# Patient Record
Sex: Male | Born: 1955 | Race: White | Hispanic: No | Smoking: Current every day smoker
Health system: Southern US, Community
[De-identification: ages and names within clinical notes are randomized; demographics above are authoritative.]

## PROBLEM LIST (undated history)

## (undated) DIAGNOSIS — G4730 Sleep apnea, unspecified: Secondary | ICD-10-CM

## (undated) DIAGNOSIS — I10 Essential (primary) hypertension: Secondary | ICD-10-CM

## (undated) DIAGNOSIS — E785 Hyperlipidemia, unspecified: Secondary | ICD-10-CM

## (undated) DIAGNOSIS — E079 Disorder of thyroid, unspecified: Secondary | ICD-10-CM

## (undated) DIAGNOSIS — E039 Hypothyroidism, unspecified: Secondary | ICD-10-CM

## (undated) HISTORY — DX: Essential (primary) hypertension: I10

## (undated) HISTORY — DX: Hyperlipidemia, unspecified: E78.5

## (undated) HISTORY — DX: Disorder of thyroid, unspecified: E07.9

## (undated) HISTORY — PX: THYROIDECTOMY: SHX17

---

## 2001-04-23 ENCOUNTER — Encounter: Admission: RE | Admit: 2001-04-23 | Discharge: 2001-04-23 | Payer: Self-pay | Admitting: Family Medicine

## 2001-04-23 ENCOUNTER — Encounter: Payer: Self-pay | Admitting: Family Medicine

## 2005-03-13 ENCOUNTER — Encounter: Admission: RE | Admit: 2005-03-13 | Discharge: 2005-03-13 | Payer: Self-pay | Admitting: Family Medicine

## 2005-03-17 ENCOUNTER — Encounter: Admission: RE | Admit: 2005-03-17 | Discharge: 2005-03-17 | Payer: Self-pay | Admitting: Family Medicine

## 2005-03-21 ENCOUNTER — Encounter: Admission: RE | Admit: 2005-03-21 | Discharge: 2005-03-21 | Payer: Self-pay | Admitting: Family Medicine

## 2005-05-22 ENCOUNTER — Encounter: Admission: RE | Admit: 2005-05-22 | Discharge: 2005-05-22 | Payer: Self-pay | Admitting: Endocrinology

## 2005-05-22 ENCOUNTER — Other Ambulatory Visit: Admission: RE | Admit: 2005-05-22 | Discharge: 2005-05-22 | Payer: Self-pay | Admitting: Interventional Radiology

## 2005-09-11 ENCOUNTER — Ambulatory Visit (HOSPITAL_COMMUNITY): Admission: RE | Admit: 2005-09-11 | Discharge: 2005-09-12 | Payer: Self-pay | Admitting: Surgery

## 2008-11-27 ENCOUNTER — Ambulatory Visit: Payer: Self-pay | Admitting: Internal Medicine

## 2013-09-28 ENCOUNTER — Ambulatory Visit
Admission: RE | Admit: 2013-09-28 | Discharge: 2013-09-28 | Disposition: A | Discharge status: Home / Self Care | Payer: 59 | Source: Ambulatory Visit | Attending: Family Medicine | Admitting: Family Medicine

## 2013-09-28 ENCOUNTER — Other Ambulatory Visit: Payer: Self-pay | Admitting: Family Medicine

## 2013-09-28 DIAGNOSIS — R062 Wheezing: Secondary | ICD-10-CM

## 2013-10-19 ENCOUNTER — Other Ambulatory Visit: Payer: Self-pay | Admitting: Family Medicine

## 2013-10-19 DIAGNOSIS — R059 Cough, unspecified: Secondary | ICD-10-CM

## 2013-10-19 DIAGNOSIS — R05 Cough: Secondary | ICD-10-CM

## 2013-10-20 ENCOUNTER — Inpatient Hospital Stay: Admission: RE | Admit: 2013-10-20 | Payer: 59 | Source: Ambulatory Visit

## 2013-10-21 ENCOUNTER — Ambulatory Visit
Admission: RE | Admit: 2013-10-21 | Discharge: 2013-10-21 | Disposition: A | Discharge status: Home / Self Care | Payer: 59 | Source: Ambulatory Visit | Attending: Family Medicine | Admitting: Family Medicine

## 2013-10-21 DIAGNOSIS — R05 Cough: Secondary | ICD-10-CM

## 2013-10-21 DIAGNOSIS — R059 Cough, unspecified: Secondary | ICD-10-CM

## 2013-10-21 MED ORDER — IOHEXOL 300 MG/ML  SOLN
75.0000 mL | Freq: Once | INTRAMUSCULAR | Status: AC | PRN
  Administered 2013-10-21: 75 mL via INTRAVENOUS

## 2015-01-14 HISTORY — PX: CATARACT EXTRACTION: SUR2

## 2015-06-07 ENCOUNTER — Ambulatory Visit: Payer: Self-pay | Admitting: General Surgery

## 2016-02-29 DIAGNOSIS — E039 Hypothyroidism, unspecified: Secondary | ICD-10-CM | POA: Diagnosis not present

## 2016-03-06 DIAGNOSIS — H35372 Puckering of macula, left eye: Secondary | ICD-10-CM | POA: Diagnosis not present

## 2016-03-06 DIAGNOSIS — H43811 Vitreous degeneration, right eye: Secondary | ICD-10-CM | POA: Diagnosis not present

## 2016-03-06 DIAGNOSIS — H35363 Drusen (degenerative) of macula, bilateral: Secondary | ICD-10-CM | POA: Diagnosis not present

## 2016-03-06 DIAGNOSIS — H35411 Lattice degeneration of retina, right eye: Secondary | ICD-10-CM | POA: Diagnosis not present

## 2016-04-23 DIAGNOSIS — E039 Hypothyroidism, unspecified: Secondary | ICD-10-CM | POA: Diagnosis not present

## 2016-04-23 DIAGNOSIS — E7800 Pure hypercholesterolemia, unspecified: Secondary | ICD-10-CM | POA: Diagnosis not present

## 2016-08-05 DIAGNOSIS — E039 Hypothyroidism, unspecified: Secondary | ICD-10-CM | POA: Diagnosis not present

## 2016-08-05 DIAGNOSIS — I10 Essential (primary) hypertension: Secondary | ICD-10-CM | POA: Diagnosis not present

## 2016-08-05 DIAGNOSIS — E782 Mixed hyperlipidemia: Secondary | ICD-10-CM | POA: Diagnosis not present

## 2016-08-05 DIAGNOSIS — G4733 Obstructive sleep apnea (adult) (pediatric): Secondary | ICD-10-CM | POA: Diagnosis not present

## 2016-11-11 DIAGNOSIS — Z23 Encounter for immunization: Secondary | ICD-10-CM | POA: Diagnosis not present

## 2017-02-06 DIAGNOSIS — E039 Hypothyroidism, unspecified: Secondary | ICD-10-CM | POA: Diagnosis not present

## 2017-02-06 DIAGNOSIS — I10 Essential (primary) hypertension: Secondary | ICD-10-CM | POA: Diagnosis not present

## 2017-02-06 DIAGNOSIS — Z125 Encounter for screening for malignant neoplasm of prostate: Secondary | ICD-10-CM | POA: Diagnosis not present

## 2017-02-06 DIAGNOSIS — Z0000 Encounter for general adult medical examination without abnormal findings: Secondary | ICD-10-CM | POA: Diagnosis not present

## 2017-02-23 DIAGNOSIS — Z720 Tobacco use: Secondary | ICD-10-CM | POA: Diagnosis not present

## 2017-02-23 DIAGNOSIS — G4733 Obstructive sleep apnea (adult) (pediatric): Secondary | ICD-10-CM | POA: Diagnosis not present

## 2017-02-26 ENCOUNTER — Encounter (HOSPITAL_COMMUNITY): Payer: Self-pay | Admitting: *Deleted

## 2017-02-26 DIAGNOSIS — K4020 Bilateral inguinal hernia, without obstruction or gangrene, not specified as recurrent: Secondary | ICD-10-CM | POA: Diagnosis not present

## 2017-02-26 DIAGNOSIS — K429 Umbilical hernia without obstruction or gangrene: Secondary | ICD-10-CM | POA: Diagnosis not present

## 2017-02-27 ENCOUNTER — Ambulatory Visit: Payer: Self-pay | Admitting: General Surgery

## 2017-02-27 NOTE — H&P (Signed)
  palpable masses. Face Global Assessment - atraumatic. Thyroid Gland Characteristics - normal size and consistency.  Eye Eyeball - Bilateral-Extraocular movements intact. Sclera/Conjunctiva - Bilateral-No scleral icterus, No Discharge.  ENMT Nose and Sinuses External Inspection of the Nose - no deformities observed, no swelling present.  Chest and Lung Exam Palpation Palpation of the chest reveals - Non-tender. Auscultation Breath sounds - Normal.  Cardiovascular Auscultation Rhythm - Regular. Heart Sounds - S1 WNL and S2 WNL. Carotid arteries - No Carotid bruit.  Abdomen Inspection Inspection of the abdomen reveals - No Visible peristalsis, No Abnormal pulsations and No Paradoxical movements. Palpation/Percussion Palpation and Percussion of the abdomen reveal - Soft, Non Tender, No Rebound tenderness, No Rigidity (guarding), No hepatosplenomegaly and No Palpable abdominal masses. Note: left groin bulge and palpable defect, right small inguinal palpable defect, small umbilical hernia   Peripheral Vascular Upper Extremity Palpation - Pulses bilaterally normal. Lower Extremity Palpation - Edema - Bilateral - No edema.  Neurologic Neurologic evaluation reveals -normal sensation and normal coordination.  Neuropsychiatric Mental status exam performed with findings of-able to articulate well with normal speech/language, rate, volume and coherence and thought content normal with ability to perform basic computations and apply abstract reasoning.  Musculoskeletal Normal Exam - Bilateral-Upper Extremity Strength Normal and Lower Extremity Strength Normal.    Assessment & Plan John Randolph(Ismael Karge A. Red Mandt MD; 02/26/2017 11:56 AM) UMBILICAL HERNIA WITHOUT OBSTRUCTION AND WITHOUT GANGRENE (K42.9) Impression: We will plan to perform a primary umbilical hernia repair at time of the laparoscopic bilateral inguinal hernia repair. BILATERAL INGUINAL HERNIA WITHOUT OBSTRUCTION OR  GANGRENE, RECURRENCE NOT SPECIFIED (K40.20) Impression: 62 year old male with bilateral inguinal hernias reducible on exam. The patient has a symptomatic reducible hernia. We discussed the etiology of his hernia, the risk of it enlarging, incarceration, obstruction, strangulation, and that it is unlikely to get smaller or better on its own. We discussed operative options of laparoscopic vs open repair with mesh including the risks of recurrence, injury to intestines or abominal organs, chronic pain associated with mesh, ischemic orchiditis or testicular injury requiring orchiectomy. We decided to proceed with laparoscopic bilatera inguinal hernia repair with mesh.

## 2017-02-27 NOTE — H&P (View-Only) (Signed)
  palpable masses. Face Global Assessment - atraumatic. Thyroid Gland Characteristics - normal size and consistency.  Eye Eyeball - Bilateral-Extraocular movements intact. Sclera/Conjunctiva - Bilateral-No scleral icterus, No Discharge.  ENMT Nose and Sinuses External Inspection of the Nose - no deformities observed, no swelling present.  Chest and Lung Exam Palpation Palpation of the chest reveals - Non-tender. Auscultation Breath sounds - Normal.  Cardiovascular Auscultation Rhythm - Regular. Heart Sounds - S1 WNL and S2 WNL. Carotid arteries - No Carotid bruit.  Abdomen Inspection Inspection of the abdomen reveals - No Visible peristalsis, No Abnormal pulsations and No Paradoxical movements. Palpation/Percussion Palpation and Percussion of the abdomen reveal - Soft, Non Tender, No Rebound tenderness, No Rigidity (guarding), No hepatosplenomegaly and No Palpable abdominal masses. Note: left groin bulge and palpable defect, right small inguinal palpable defect, small umbilical hernia   Peripheral Vascular Upper Extremity Palpation - Pulses bilaterally normal. Lower Extremity Palpation - Edema - Bilateral - No edema.  Neurologic Neurologic evaluation reveals -normal sensation and normal coordination.  Neuropsychiatric Mental status exam performed with findings of-able to articulate well with normal speech/language, rate, volume and coherence and thought content normal with ability to perform basic computations and apply abstract reasoning.  Musculoskeletal Normal Exam - Bilateral-Upper Extremity Strength Normal and Lower Extremity Strength Normal.    Assessment & Plan (Luke A. Kinsinger MD; 02/26/2017 11:56 AM) UMBILICAL HERNIA WITHOUT OBSTRUCTION AND WITHOUT GANGRENE (K42.9) Impression: We will plan to perform a primary umbilical hernia repair at time of the laparoscopic bilateral inguinal hernia repair. BILATERAL INGUINAL HERNIA WITHOUT OBSTRUCTION OR  GANGRENE, RECURRENCE NOT SPECIFIED (K40.20) Impression: 62-year-old male with bilateral inguinal hernias reducible on exam. The patient has a symptomatic reducible hernia. We discussed the etiology of his hernia, the risk of it enlarging, incarceration, obstruction, strangulation, and that it is unlikely to get smaller or better on its own. We discussed operative options of laparoscopic vs open repair with mesh including the risks of recurrence, injury to intestines or abominal organs, chronic pain associated with mesh, ischemic orchiditis or testicular injury requiring orchiectomy. We decided to proceed with laparoscopic bilatera inguinal hernia repair with mesh. 

## 2017-03-03 NOTE — Patient Instructions (Addendum)
John Randolph  03/03/2017   Your procedure is scheduled on: Thursday 03/12/2017  Report to [facility] Main  Entrance              Report to admitting at  0730  AM   Call this number if you have problems the morning of surgery [phone]   NO SOLID FOOD AFTER MIDNIGHT THE NIGHT PRIOR TO SURGERY. NOTHING BY MOUTH EXCEPT CLEAR LIQUIDS UNTIL 3 HOURS PRIOR TO SCHEULED SURGERY. PLEASE FINISH ENSURE DRINK PER SURGEON ORDER 3 HOURS PRIOR TO SCHEDULED SURGERY TIME WHICH NEEDS TO BE COMPLETED AT  0630 am.   CLEAR LIQUID DIET   Foods Allowed                                                                     Foods Excluded  Coffee and tea, regular and decaf                             liquids that you cannot  Plain Jell-O in any flavor                                             see through such as: Fruit ices (not with fruit pulp)                                     milk, soups, orange juice  Iced Popsicles                                    All solid food Carbonated beverages, regular and diet                                    Cranberry, grape and apple juices Sports drinks like Gatorade Lightly seasoned clear broth or consume(fat free) Sugar, honey syrup  Sample Menu Breakfast                                Lunch                                     Supper Cranberry juice                    Beef broth                            Chicken broth Jell-O  Grape juice                           Apple juice Coffee or tea                        Jell-O                                      Popsicle                                                Coffee or tea                        Coffee or tea  _____________________________________________________________________    Remember: Do not eat food or drink liquids :After Midnight.     Take these medicines the morning of surgery with A SIP OF WATER: LEVOTHYROXINE (SYNTHROID)                         You may not have any metal on your body including hair pins and              piercings  Do not wear jewelry, make-up, lotions, powders or perfumes, deodorant             Do not wear nail polish.  Do not shave  48 hours prior to surgery.              Men may shave face and neck.   Do not bring valuables to the hospital. [facility] IS NOT             RESPONSIBLE   FOR VALUABLES.  Contacts, dentures or bridgework may not be worn into surgery.  Leave suitcase in the car. After surgery it may be brought to your room.     Patients discharged the day of surgery will not be allowed to drive home.  Name and phone number of your driver:daughter -Elmer Bales  Special Instructions: N/A              Please read over the following fact sheets you were given: _____________________________________________________________________             [facility] - Preparing for Surgery Before surgery, you can play an important role.  Because skin is not sterile, your skin needs to be as free of germs as possible.  You can reduce the number of germs on your skin by washing with CHG (chlorahexidine gluconate) soap before surgery.  CHG is an antiseptic cleaner which kills germs and bonds with the skin to continue killing germs even after washing. Please DO NOT use if you have an allergy to CHG or antibacterial soaps.  If your skin becomes reddened/irritated stop using the CHG and inform your nurse when you arrive at Short Stay. Do not shave (including legs and underarms) for at least 48 hours prior to the first CHG shower.  You may shave your face/neck. Please follow these instructions carefully:  1.  Shower with CHG Soap the night before surgery and the  morning of Surgery.  2.  If you choose to wash your hair, wash your hair  first as usual with your  normal  shampoo.  3.  After you shampoo, rinse your hair and body thoroughly to remove the  shampoo.                           4.  Use CHG  as you would any other liquid soap.  You can apply chg directly  to the skin and wash                       Gently with a scrungie or clean washcloth.  5.  Apply the CHG Soap to your body ONLY FROM THE NECK DOWN.   Do not use on face/ open                           Wound or open sores. Avoid contact with eyes, ears mouth and genitals (private parts).                       Wash face,  Genitals (private parts) with your normal soap.             6.  Wash thoroughly, paying special attention to the area where your surgery  will be performed.  7.  Thoroughly rinse your body with warm water from the neck down.  8.  DO NOT shower/wash with your normal soap after using and rinsing off  the CHG Soap.                9.  Pat yourself dry with a clean towel.            10.  Wear clean pajamas.            11.  Place clean sheets on your bed the night of your first shower and do not  sleep with pets. Day of Surgery : Do not apply any lotions/deodorants the morning of surgery.  Please wear clean clothes to the hospital/surgery center.  FAILURE TO FOLLOW THESE INSTRUCTIONS MAY RESULT IN THE CANCELLATION OF YOUR SURGERY PATIENT SIGNATURE_________________________________  NURSE SIGNATURE__________________________________  ________________________________________________________________________

## 2017-03-04 ENCOUNTER — Encounter (HOSPITAL_COMMUNITY)
Admission: RE | Admit: 2017-03-04 | Discharge: 2017-03-04 | Disposition: A | Discharge status: Home / Self Care | Payer: 59 | Source: Ambulatory Visit | Attending: General Surgery | Admitting: General Surgery

## 2017-03-04 ENCOUNTER — Other Ambulatory Visit: Payer: Self-pay

## 2017-03-04 ENCOUNTER — Encounter (HOSPITAL_COMMUNITY): Payer: Self-pay

## 2017-03-04 ENCOUNTER — Encounter (INDEPENDENT_AMBULATORY_CARE_PROVIDER_SITE_OTHER): Payer: Self-pay

## 2017-03-04 DIAGNOSIS — I10 Essential (primary) hypertension: Secondary | ICD-10-CM | POA: Diagnosis not present

## 2017-03-04 DIAGNOSIS — I4510 Unspecified right bundle-branch block: Secondary | ICD-10-CM | POA: Insufficient documentation

## 2017-03-04 DIAGNOSIS — Z01818 Encounter for other preprocedural examination: Secondary | ICD-10-CM | POA: Diagnosis not present

## 2017-03-04 DIAGNOSIS — R9431 Abnormal electrocardiogram [ECG] [EKG]: Secondary | ICD-10-CM | POA: Insufficient documentation

## 2017-03-04 DIAGNOSIS — K4020 Bilateral inguinal hernia, without obstruction or gangrene, not specified as recurrent: Secondary | ICD-10-CM | POA: Insufficient documentation

## 2017-03-04 HISTORY — DX: Hypothyroidism, unspecified: E03.9

## 2017-03-04 HISTORY — DX: Sleep apnea, unspecified: G47.30

## 2017-03-04 LAB — CBC WITH DIFFERENTIAL/PLATELET
BASOS ABS: 0 10*3/uL (ref 0.0–0.1)
BASOS PCT: 1 %
Eosinophils Absolute: 0.1 10*3/uL (ref 0.0–0.7)
Eosinophils Relative: 1 %
HEMATOCRIT: 45.3 % (ref 39.0–52.0)
Hemoglobin: 16 g/dL (ref 13.0–17.0)
LYMPHS PCT: 26 %
Lymphs Abs: 2.2 10*3/uL (ref 0.7–4.0)
MCH: 31.7 pg (ref 26.0–34.0)
MCHC: 35.3 g/dL (ref 30.0–36.0)
MCV: 89.9 fL (ref 78.0–100.0)
Monocytes Absolute: 0.9 10*3/uL (ref 0.1–1.0)
Monocytes Relative: 11 %
NEUTROS ABS: 5.3 10*3/uL (ref 1.7–7.7)
Neutrophils Relative %: 61 %
PLATELETS: 253 10*3/uL (ref 150–400)
RBC: 5.04 MIL/uL (ref 4.22–5.81)
RDW: 13.8 % (ref 11.5–15.5)
WBC: 8.5 10*3/uL (ref 4.0–10.5)

## 2017-03-04 LAB — BASIC METABOLIC PANEL
ANION GAP: 11 (ref 5–15)
BUN: 14 mg/dL (ref 6–20)
CO2: 23 mmol/L (ref 22–32)
Calcium: 8.8 mg/dL — ABNORMAL LOW (ref 8.9–10.3)
Chloride: 104 mmol/L (ref 101–111)
Creatinine, Ser: 0.99 mg/dL (ref 0.61–1.24)
GFR calc non Af Amer: >60 mL/min (ref >60)
Glucose, Bld: 102 mg/dL — ABNORMAL HIGH (ref 65–99)
POTASSIUM: 4.1 mmol/L (ref 3.5–5.1)
SODIUM: 138 mmol/L (ref 135–145)

## 2017-03-09 ENCOUNTER — Other Ambulatory Visit (HOSPITAL_BASED_OUTPATIENT_CLINIC_OR_DEPARTMENT_OTHER): Payer: Self-pay

## 2017-03-09 DIAGNOSIS — G4733 Obstructive sleep apnea (adult) (pediatric): Secondary | ICD-10-CM

## 2017-03-12 ENCOUNTER — Ambulatory Visit (HOSPITAL_COMMUNITY): Payer: 59 | Admitting: Anesthesiology

## 2017-03-12 ENCOUNTER — Encounter (HOSPITAL_COMMUNITY)
Admission: RE | Disposition: A | Discharge status: Home / Self Care | Payer: Self-pay | Source: Ambulatory Visit | Attending: General Surgery

## 2017-03-12 ENCOUNTER — Ambulatory Visit (HOSPITAL_COMMUNITY)
Admission: RE | Admit: 2017-03-12 | Discharge: 2017-03-12 | Disposition: A | Discharge status: Home / Self Care | Payer: 59 | Source: Ambulatory Visit | Attending: General Surgery | Admitting: General Surgery

## 2017-03-12 ENCOUNTER — Encounter (HOSPITAL_COMMUNITY): Payer: Self-pay

## 2017-03-12 DIAGNOSIS — G4730 Sleep apnea, unspecified: Secondary | ICD-10-CM | POA: Insufficient documentation

## 2017-03-12 DIAGNOSIS — I10 Essential (primary) hypertension: Secondary | ICD-10-CM | POA: Insufficient documentation

## 2017-03-12 DIAGNOSIS — K4020 Bilateral inguinal hernia, without obstruction or gangrene, not specified as recurrent: Secondary | ICD-10-CM | POA: Insufficient documentation

## 2017-03-12 DIAGNOSIS — K429 Umbilical hernia without obstruction or gangrene: Secondary | ICD-10-CM | POA: Insufficient documentation

## 2017-03-12 DIAGNOSIS — Z79899 Other long term (current) drug therapy: Secondary | ICD-10-CM | POA: Diagnosis not present

## 2017-03-12 DIAGNOSIS — F17200 Nicotine dependence, unspecified, uncomplicated: Secondary | ICD-10-CM | POA: Diagnosis not present

## 2017-03-12 DIAGNOSIS — E039 Hypothyroidism, unspecified: Secondary | ICD-10-CM | POA: Diagnosis not present

## 2017-03-12 HISTORY — PX: LAPAROSCOPIC INGUINAL HERNIA WITH UMBILICAL HERNIA: SHX5658

## 2017-03-12 SURGERY — LAPAROSCOPIC INGUINAL HERNIA WITH UMBILICAL HERNIA
Anesthesia: General | Site: Abdomen

## 2017-03-12 MED ORDER — FENTANYL CITRATE (PF) 250 MCG/5ML IJ SOLN
INTRAMUSCULAR | Status: AC
  Filled 2017-03-12: qty 5

## 2017-03-12 MED ORDER — PROMETHAZINE HCL 25 MG/ML IJ SOLN: 6.2500 mg | INTRAMUSCULAR | Status: DC | PRN

## 2017-03-12 MED ORDER — MEPERIDINE HCL 50 MG/ML IJ SOLN: 6.2500 mg | INTRAMUSCULAR | Status: DC | PRN

## 2017-03-12 MED ORDER — SUGAMMADEX SODIUM 200 MG/2ML IV SOLN
INTRAVENOUS | Status: DC | PRN
  Administered 2017-03-12: 150 mg via INTRAVENOUS

## 2017-03-12 MED ORDER — MIDAZOLAM HCL 2 MG/2ML IJ SOLN
INTRAMUSCULAR | Status: AC
  Filled 2017-03-12: qty 2

## 2017-03-12 MED ORDER — FENTANYL CITRATE (PF) 100 MCG/2ML IJ SOLN
INTRAMUSCULAR | Status: DC | PRN
  Administered 2017-03-12 (×2): 100 ug via INTRAVENOUS

## 2017-03-12 MED ORDER — OXYCODONE HCL 5 MG PO TABS
5.0000 mg | ORAL_TABLET | Freq: Once | ORAL | Status: AC | PRN
  Administered 2017-03-12: 5 mg via ORAL

## 2017-03-12 MED ORDER — FENTANYL CITRATE (PF) 100 MCG/2ML IJ SOLN
INTRAMUSCULAR | Status: AC
  Administered 2017-03-12: 50 ug via INTRAVENOUS
  Filled 2017-03-12: qty 2

## 2017-03-12 MED ORDER — OXYCODONE HCL 5 MG PO TABS
ORAL_TABLET | ORAL | Status: AC
  Administered 2017-03-12: 5 mg via ORAL
  Filled 2017-03-12: qty 1

## 2017-03-12 MED ORDER — PROPOFOL 10 MG/ML IV BOLUS
INTRAVENOUS | Status: AC
  Filled 2017-03-12: qty 20

## 2017-03-12 MED ORDER — CEFAZOLIN SODIUM-DEXTROSE 2-4 GM/100ML-% IV SOLN
2.0000 g | INTRAVENOUS | Status: AC
  Administered 2017-03-12: 2 g via INTRAVENOUS
  Filled 2017-03-12: qty 100

## 2017-03-12 MED ORDER — BUPIVACAINE-EPINEPHRINE 0.25% -1:200000 IJ SOLN
INTRAMUSCULAR | Status: AC
  Filled 2017-03-12: qty 1

## 2017-03-12 MED ORDER — 0.9 % SODIUM CHLORIDE (POUR BTL) OPTIME
TOPICAL | Status: DC | PRN
  Administered 2017-03-12: 1000 mL

## 2017-03-12 MED ORDER — OXYCODONE HCL 5 MG/5ML PO SOLN
5.0000 mg | Freq: Once | ORAL | Status: AC | PRN
  Filled 2017-03-12: qty 5

## 2017-03-12 MED ORDER — ONDANSETRON HCL 4 MG/2ML IJ SOLN
INTRAMUSCULAR | Status: AC
  Filled 2017-03-12: qty 2

## 2017-03-12 MED ORDER — MIDAZOLAM HCL 5 MG/5ML IJ SOLN
INTRAMUSCULAR | Status: DC | PRN
  Administered 2017-03-12: 2 mg via INTRAVENOUS

## 2017-03-12 MED ORDER — BUPIVACAINE-EPINEPHRINE (PF) 0.25% -1:200000 IJ SOLN
INTRAMUSCULAR | Status: DC | PRN
  Administered 2017-03-12: 50 mL

## 2017-03-12 MED ORDER — EPHEDRINE SULFATE-NACL 50-0.9 MG/10ML-% IV SOSY
PREFILLED_SYRINGE | INTRAVENOUS | Status: DC | PRN
  Administered 2017-03-12: 10 mg via INTRAVENOUS

## 2017-03-12 MED ORDER — GABAPENTIN 300 MG PO CAPS
300.0000 mg | ORAL_CAPSULE | ORAL | Status: AC
  Administered 2017-03-12: 300 mg via ORAL
  Filled 2017-03-12: qty 1

## 2017-03-12 MED ORDER — FENTANYL CITRATE (PF) 100 MCG/2ML IJ SOLN
25.0000 ug | INTRAMUSCULAR | Status: DC | PRN
  Administered 2017-03-12 (×3): 50 ug via INTRAVENOUS

## 2017-03-12 MED ORDER — IBUPROFEN 800 MG PO TABS: 800.0000 mg | ORAL_TABLET | Freq: Three times a day (TID) | ORAL | 0 refills | Status: DC | PRN

## 2017-03-12 MED ORDER — LACTATED RINGERS IR SOLN
Status: DC | PRN
  Administered 2017-03-12: 1000 mL

## 2017-03-12 MED ORDER — HYDROCODONE-ACETAMINOPHEN 5-325 MG PO TABS: 1.0000 | ORAL_TABLET | Freq: Four times a day (QID) | ORAL | 0 refills | Status: DC | PRN

## 2017-03-12 MED ORDER — CHLORHEXIDINE GLUCONATE CLOTH 2 % EX PADS: 6.0000 | MEDICATED_PAD | Freq: Once | CUTANEOUS | Status: DC

## 2017-03-12 MED ORDER — PROPOFOL 10 MG/ML IV BOLUS
INTRAVENOUS | Status: DC | PRN
  Administered 2017-03-12: 160 mg via INTRAVENOUS

## 2017-03-12 MED ORDER — ONDANSETRON HCL 4 MG/2ML IJ SOLN
INTRAMUSCULAR | Status: DC | PRN
  Administered 2017-03-12: 4 mg via INTRAVENOUS

## 2017-03-12 MED ORDER — BUPIVACAINE LIPOSOME 1.3 % IJ SUSP
20.0000 mL | Freq: Once | INTRAMUSCULAR | Status: AC
  Administered 2017-03-12: 20 mL
  Filled 2017-03-12: qty 20

## 2017-03-12 MED ORDER — ACETAMINOPHEN 500 MG PO TABS
1000.0000 mg | ORAL_TABLET | ORAL | Status: AC
  Administered 2017-03-12: 1000 mg via ORAL
  Filled 2017-03-12: qty 2

## 2017-03-12 MED ORDER — ENSURE PRE-SURGERY PO LIQD
296.0000 mL | Freq: Once | ORAL | Status: AC
  Administered 2017-03-12: 296 mL via ORAL
  Filled 2017-03-12: qty 296

## 2017-03-12 MED ORDER — DEXAMETHASONE SODIUM PHOSPHATE 10 MG/ML IJ SOLN
INTRAMUSCULAR | Status: DC | PRN
  Administered 2017-03-12: 5 mg via INTRAVENOUS

## 2017-03-12 MED ORDER — LIDOCAINE 2% (20 MG/ML) 5 ML SYRINGE
INTRAMUSCULAR | Status: DC | PRN
  Administered 2017-03-12: 60 mg via INTRAVENOUS

## 2017-03-12 MED ORDER — LACTATED RINGERS IV SOLN
INTRAVENOUS | Status: DC
  Administered 2017-03-12 (×2): via INTRAVENOUS

## 2017-03-12 MED ORDER — ROCURONIUM BROMIDE 10 MG/ML (PF) SYRINGE
PREFILLED_SYRINGE | INTRAVENOUS | Status: DC | PRN
  Administered 2017-03-12: 40 mg via INTRAVENOUS
  Administered 2017-03-12: 10 mg via INTRAVENOUS

## 2017-03-12 SURGICAL SUPPLY — 41 items
ADH SKN CLS APL DERMABOND .7 (GAUZE/BANDAGES/DRESSINGS) ×1
APL SKNCLS STERI-STRIP NONHPOA (GAUZE/BANDAGES/DRESSINGS) ×1
APPLIER CLIP 5 13 M/L LIGAMAX5 (MISCELLANEOUS)
APR CLP MED LRG 5 ANG JAW (MISCELLANEOUS)
BANDAGE ADH SHEER 1  50/CT (GAUZE/BANDAGES/DRESSINGS) ×6 IMPLANT
BENZOIN TINCTURE PRP APPL 2/3 (GAUZE/BANDAGES/DRESSINGS) ×2 IMPLANT
CABLE HIGH FREQUENCY MONO STRZ (ELECTRODE) ×2 IMPLANT
CHLORAPREP W/TINT 26ML (MISCELLANEOUS) ×2 IMPLANT
CLIP APPLIE 5 13 M/L LIGAMAX5 (MISCELLANEOUS) IMPLANT
COVER SURGICAL LIGHT HANDLE (MISCELLANEOUS) ×2 IMPLANT
DECANTER SPIKE VIAL GLASS SM (MISCELLANEOUS) ×2 IMPLANT
DERMABOND ADVANCED (GAUZE/BANDAGES/DRESSINGS) ×1
DERMABOND ADVANCED .7 DNX12 (GAUZE/BANDAGES/DRESSINGS) IMPLANT
DEVICE SECURE STRAP 25 ABSORB (INSTRUMENTS) ×2 IMPLANT
DRAIN CHANNEL 19F RND (DRAIN) IMPLANT
ELECT PENCIL ROCKER SW 15FT (MISCELLANEOUS) ×1 IMPLANT
ELECT REM PT RETURN 15FT ADLT (MISCELLANEOUS) ×2 IMPLANT
EVACUATOR SILICONE 100CC (DRAIN) IMPLANT
GLOVE BIO SURGEON STRL SZ7 (GLOVE) ×2 IMPLANT
GLOVE BIOGEL PI IND STRL 7.0 (GLOVE) ×1 IMPLANT
GLOVE BIOGEL PI INDICATOR 7.0 (GLOVE) ×1
GLOVE SURG SS PI 7.0 STRL IVOR (GLOVE) ×2 IMPLANT
GOWN STRL REUS W/TWL LRG LVL3 (GOWN DISPOSABLE) ×2 IMPLANT
GOWN STRL REUS W/TWL XL LVL3 (GOWN DISPOSABLE) ×4 IMPLANT
KIT BASIN OR (CUSTOM PROCEDURE TRAY) ×2 IMPLANT
MESH 3DMAX LIGHT 4.8X6.7 LT XL (Mesh General) ×1 IMPLANT
MESH 3DMAX LIGHT 4.8X6.7 RT XL (Mesh General) ×1 IMPLANT
SCISSORS LAP 5X35 DISP (ENDOMECHANICALS) IMPLANT
SET IRRIG TUBING LAPAROSCOPIC (IRRIGATION / IRRIGATOR) ×1 IMPLANT
STRIP CLOSURE SKIN 1/2X4 (GAUZE/BANDAGES/DRESSINGS) ×2 IMPLANT
SUT ETHILON 2 0 PS N (SUTURE) IMPLANT
SUT MNCRL AB 4-0 PS2 18 (SUTURE) ×2 IMPLANT
SUT PDS AB 0 CT1 36 (SUTURE) ×2 IMPLANT
SUT VIC AB 3-0 SH 27 (SUTURE) ×2
SUT VIC AB 3-0 SH 27X BRD (SUTURE) IMPLANT
SUT VICRYL 0 UR6 27IN ABS (SUTURE) ×1 IMPLANT
TOWEL OR 17X26 10 PK STRL BLUE (TOWEL DISPOSABLE) ×2 IMPLANT
TRAY LAPAROSCOPIC (CUSTOM PROCEDURE TRAY) ×2 IMPLANT
TROCAR CANNULA W/PORT DUAL 5MM (MISCELLANEOUS) ×2 IMPLANT
TROCAR XCEL 12X100 BLDLESS (ENDOMECHANICALS) ×2 IMPLANT
TUBING INSUF HEATED (TUBING) ×2 IMPLANT

## 2017-03-12 NOTE — Anesthesia Preprocedure Evaluation (Signed)
Anesthesia Evaluation  Patient identified by MRN, date of birth, ID band Patient awake    Reviewed: Allergy & Precautions, NPO status , Patient's Chart, lab work & pertinent test results  Airway Mallampati: II  TM Distance: >3 FB Neck ROM: Full    Dental no notable dental hx.    Pulmonary sleep apnea , Current Smoker,    Pulmonary exam normal breath sounds clear to auscultation       Cardiovascular hypertension, Pt. on medications Normal cardiovascular exam Rhythm:Regular Rate:Normal     Neuro/Psych negative neurological ROS  negative psych ROS   GI/Hepatic negative GI ROS, Neg liver ROS,   Endo/Other  Hypothyroidism   Renal/GU negative Renal ROS     Musculoskeletal negative musculoskeletal ROS (+)   Abdominal   Peds  Hematology negative hematology ROS (+)   Anesthesia Other Findings   Reproductive/Obstetrics negative OB ROS                             Anesthesia Physical Anesthesia Plan  ASA: III  Anesthesia Plan: General   Post-op Pain Management:  Regional for Post-op pain   Induction: Intravenous  PONV Risk Score and Plan: 2 and Ondansetron and Dexamethasone  Airway Management Planned: LMA  Additional Equipment:   Intra-op Plan:   Post-operative Plan: Extubation in OR  Informed Consent: I have reviewed the patients History and Physical, chart, labs and discussed the procedure including the risks, benefits and alternatives for the proposed anesthesia with the patient or authorized representative who has indicated his/her understanding and acceptance.   Dental advisory given  Plan Discussed with: CRNA  Anesthesia Plan Comments:         Anesthesia Quick Evaluation

## 2017-03-12 NOTE — Discharge Instructions (Signed)
General Anesthesia, Adult, Care After °These instructions provide you with information about caring for yourself after your procedure. Your health care provider may also give you more specific instructions. Your treatment has been planned according to current medical practices, but problems sometimes occur. Call your health care provider if you have any problems or questions after your procedure. °What can I expect after the procedure? °After the procedure, it is common to have: °· Vomiting. °· A sore throat. °· Mental slowness. ° °It is common to feel: °· Nauseous. °· Cold or shivery. °· Sleepy. °· Tired. °· Sore or achy, even in parts of your body where you did not have surgery. ° °Follow these instructions at home: °For at least 24 hours after the procedure: °· Do not: °? Participate in activities where you could fall or become injured. °? Drive. °? Use heavy machinery. °? Drink alcohol. °? Take sleeping pills or medicines that cause drowsiness. °? Make important decisions or sign legal documents. °? Take care of children on your own. °· Rest. °Eating and drinking °· If you vomit, drink water, juice, or soup when you can drink without vomiting. °· Drink enough fluid to keep your urine clear or pale yellow. °· Make sure you have little or no nausea before eating solid foods. °· Follow the diet recommended by your health care provider. °General instructions °· Have a responsible adult stay with you until you are awake and alert. °· Return to your normal activities as told by your health care provider. Ask your health care provider what activities are safe for you. °· Take over-the-counter and prescription medicines only as told by your health care provider. °· If you smoke, do not smoke without supervision. °· Keep all follow-up visits as told by your health care provider. This is important. °Contact a health care provider if: °· You continue to have nausea or vomiting at home, and medicines are not helpful. °· You  cannot drink fluids or start eating again. °· You cannot urinate after 8-12 hours. °· You develop a skin rash. °· You have fever. °· You have increasing redness at the site of your procedure. °Get help right away if: °· You have difficulty breathing. °· You have chest pain. °· You have unexpected bleeding. °· You feel that you are having a life-threatening or urgent problem. °This information is not intended to replace advice given to you by your health care provider. Make sure you discuss any questions you have with your health care provider. °Document Released: 04/07/2000 Document Revised: 06/04/2015 Document Reviewed: 12/14/2014 °Elsevier Interactive Patient Education © 2018 [employer] ° °

## 2017-03-12 NOTE — Transfer of Care (Signed)
Immediate Anesthesia Transfer of Care Note  Patient: John PatriciaHarold S Caffey  Procedure(s) Performed: LAPAROSCOPIC BILATERAL  INGUINAL HERNIA REPAIR WITH MESH AND OPEN UMBILICAL HERNIA REPAIR (N/A Abdomen)  Patient Location: PACU  Anesthesia Type:General  Level of Consciousness: sedated, patient cooperative and responds to stimulation  Airway & Oxygen Therapy: Patient Spontanous Breathing and Patient connected to face mask oxygen  Post-op Assessment: Report given to RN and Post -op Vital signs reviewed and stable  Post vital signs: Reviewed and stable  Last Vitals:  Vitals:   03/12/17 0747  BP: (!) 138/93  Pulse: 85  Temp: 36.5 C  SpO2: 98%    Last Pain:  Vitals:   03/12/17 0747  TempSrc: Oral         Complications: No apparent anesthesia complications

## 2017-03-12 NOTE — Anesthesia Procedure Notes (Signed)
Procedure Name: Intubation Performed by: Ember Henrikson J, CRNA Pre-anesthesia Checklist: Patient identified, Emergency Drugs available, Suction available, Patient being monitored and Timeout performed Patient Re-evaluated:Patient Re-evaluated prior to induction Oxygen Delivery Method: Circle system utilized Preoxygenation: Pre-oxygenation with 100% oxygen Induction Type: IV induction Ventilation: Mask ventilation without difficulty Laryngoscope Size: Mac and 4 Grade View: Grade I Tube type: Oral Tube size: 7.5 mm Number of attempts: 1 Airway Equipment and Method: Stylet Placement Confirmation: ETT inserted through vocal cords under direct vision,  positive ETCO2,  CO2 detector and breath sounds checked- equal and bilateral Secured at: 23 cm Tube secured with: Tape Dental Injury: Teeth and Oropharynx as per pre-operative assessment        

## 2017-03-12 NOTE — Interval H&P Note (Signed)
History and Physical Interval Note:  03/12/2017 8:49 AM  John Randolph  has presented today for surgery, with the diagnosis of BILATERAL INGUINAL HERNIA, UMBILICAL HERNIA  The various methods of treatment have been discussed with the patient and family. After consideration of risks, benefits and other options for treatment, the patient has consented to  Procedure(s): LAPAROSCOPIC BILATERAL  INGUINAL HERNIA REPAIR WITH MESH AND UMBILICAL HERNIA REPAIR (N/A) as a surgical intervention .  The patient's history has been reviewed, patient examined, no change in status, stable for surgery.  I have reviewed the patient's chart and labs.  Questions were answered to the patient's satisfaction.     De BlanchLuke Aaron Marney Treloar

## 2017-03-12 NOTE — Progress Notes (Signed)
Pt prefers for daughter to just wait in waiting room.  He doesn't want her to see him without dentures in place or just sit there next to him.  Asked pt if he would like me to just take his belongings to daughter when he goes back and he says yes please.

## 2017-03-12 NOTE — Op Note (Signed)
Preop diagnosis: bilateral inguinal hernia, umbilical hernia  Postop diagnosis: bilateral direct inguinal hernia, umbilical hernia  Procedure: laparoscopic Bilateral inguinal hernia repair with mesh, primary umbilical hernia repair  Surgeon: Feliciana Rossetti, M.D.  Asst: none  Anesthesia: Gen.   Indications for procedure: John Randolph is a 62 y.o. male with symptoms of pain and enlarging Bilateral inguinal hernia(s). After discussing risks, alternatives and benefits he decided on laparoscopic repair and was brought to day surgery for repair.  Description of procedure: The patient was brought into the operative suite, placed supine. Anesthesia was administered with endotracheal tube. Patient was strapped in place. The patient was prepped and draped in the usual sterile fashion.  Next Exparel:Marcaine mix was injected inferior of the umbilicus, and a semilunar incision was made. Dissection was used to visualize the anterior rectus sheath. Anterior rectus sheath was sharply incised, next to the 12 mm trocar was inserted into the preperitoneal space. Laparoscope was inserted to confirm placement and then used to bluntly dissect the retrorectus space clear. Once the midline ws free 2 5mm trocars were placed. On ein the suprapubic area and 1 5cm superior to the first.  On initial visualization , There was a moderate sized right direct and moderate sized left direct inguinal hernia. Next we began our dissection on the right identifying the ASIS laterally and then working back medially removing the filmy tissue and adhesions of the peritoneum to the abdominal wall. Hernia sac was completely dissected out and there was an additional small hernia sac and lipoma removed from the canal. Vas deference and contents of the cord were safely dissected away of the hernia sac.   Next, we moved to the left side. Next we began our dissection on the right identifying the ASIS laterally and then working back  medially removing the filmy tissue and adhesions of the peritoneum to the abdominal wall. Hernia sac was completely dissected out of the direct space and there was an additional small hernia sac and removed from the canal. Vas deference and contents of the cord were safely dissected away of the hernia sac.    A left extra large 3D max light weight mesh was inserted and tacked medially to the lacunar ligament. A right extra large 3D max light weight mesh was inserted and tacked medially to the lacunar ligament. There was 3cm of overlap. The mesh was positioned flat and directly up against the direct and indirect areas. Additional tacks were placed to ensure the mesh would appose well over the direct space.  The Exparel mix was infused along the line of the inguinal ligament deep to the fascia. The CO2 was evacuated while watching to ensure the mesh did not migrate.   Next, blunt dissection was used to isolate the umbilical stalk from the surrounding tissue. Cautery was used to separate the skin from the hernia sac. The sac was reduced beneath the fascia. The defect was 1cm. The defect was closed with 4 0 PDS in interrupted fashion. The anterior rectus fascia was closed with 0 vicryl in interrupted sutures. Exparel was infused into the rectus muscles. A 3-0 vicryl was used to appose the umbilical skin to the fascia and close the deep dermal space as well. All skin incisions were closed with 4-0 monocryl subcu stitch. The patient awoke from anesthesia and was brought to PACU in stable condition.  Findings: bilateral direct and left inguinal hernia, right lipoma of the cord, 1cm umbilical hernia  Specimen: none  Blood loss: 30 ml  Local anesthesia: 70 ml Exparel:Marcaine mix  Complications: none  Implant: extra large left Bard 3D max light mesh, extra large right Bard 3D max light mesh  Feliciana RossettiLuke Sedrick Tober, M.D. General, Bariatric, & Minimally Invasive Surgery [facility], [place] 11:40  AM 03/12/2017

## 2017-03-12 NOTE — Anesthesia Postprocedure Evaluation (Signed)
Anesthesia Post Note  Patient: John Randolph  Procedure(s) Performed: LAPAROSCOPIC BILATERAL  INGUINAL HERNIA REPAIR WITH MESH AND OPEN UMBILICAL HERNIA REPAIR (N/A Abdomen)     Patient location during evaluation: PACU Anesthesia Type: General Level of consciousness: sedated and patient cooperative Pain management: pain level controlled Vital Signs Assessment: post-procedure vital signs reviewed and stable Respiratory status: spontaneous breathing Cardiovascular status: stable Anesthetic complications: no    Last Vitals:  Vitals:   03/12/17 1241 03/12/17 1345  BP: (!) 127/92 103/70  Pulse: 78   Resp: 16   Temp: (!) 36.3 C   SpO2: 96%     Last Pain:  Vitals:   03/12/17 1241  TempSrc:   PainSc: 3                  Lewie LoronJohn Olukemi Panchal

## 2017-03-13 ENCOUNTER — Encounter (HOSPITAL_COMMUNITY): Payer: Self-pay | Admitting: General Surgery

## 2017-03-16 ENCOUNTER — Ambulatory Visit (HOSPITAL_BASED_OUTPATIENT_CLINIC_OR_DEPARTMENT_OTHER): Payer: 59 | Attending: Otolaryngology | Admitting: Internal Medicine

## 2017-03-16 DIAGNOSIS — G4733 Obstructive sleep apnea (adult) (pediatric): Secondary | ICD-10-CM | POA: Diagnosis present

## 2017-03-20 DIAGNOSIS — G4733 Obstructive sleep apnea (adult) (pediatric): Secondary | ICD-10-CM

## 2017-03-20 NOTE — Procedures (Signed)
    Patient Name: John Randolph, John Randolph Date: 03/17/2017 Gender: Male D.O.B: 1955-10-21 Age (years): 61 Referring Provider: Flo ShanksKarol Wolicki Height (inches): 68 Interpreting Physician: Jetty Duhamellinton Young MD, ABSM Weight (lbs): 171 RPSGT: [place], [place] BMI: 26 MRN: [id number] Neck Size: 16.00 <br> <br> CLINICAL INFORMATION Sleep Randolph Type: HST  Indication for sleep Randolph: OSA  Epworth Sleepiness Score: 14  SLEEP Randolph TECHNIQUE A multi-channel overnight portable sleep Randolph was performed. The channels recorded were: nasal airflow, thoracic respiratory movement, and oxygen saturation with a pulse oximetry. Snoring was also monitored.  MEDICATIONS Patient self administered medications include: none reported.  SLEEP ARCHITECTURE Patient was studied for 347.0 minutes. The sleep efficiency was 95.5 % and the patient was supine for 37.8%. The arousal index was 0.0 per hour.  RESPIRATORY PARAMETERS The overall AHI was 14.2 per hour, with a central apnea index of 0.0 per hour.  The oxygen nadir was 79% during sleep.  CARDIAC DATA Mean heart rate during sleep was 60.3 bpm.  IMPRESSIONS - Mild obstructive sleep apnea occurred during this Randolph (AHI = 14.2/h). - No significant central sleep apnea occurred during this Randolph (CAI = 0.0/h). - Oxygen desaturation was noted during this Randolph (Min O2 = 79%). - Patient snored.  DIAGNOSIS - Obstructive Sleep Apnea (327.23 [G47.33 ICD-10])  RECOMMENDATIONS - CPAP titration Randolph or AutoPAP. A fitted oral appliance or other options might be appropriate, based on clinical judgment. - Be careful with alcohol, sedatives and other CNS depressants that may worsen sleep apnea and disrupt normal sleep architecture. - Sleep hygiene should be reviewed to assess factors that may improve sleep quality. - Weight management and regular exercise should be initiated or continued.  [Electronically signed] 03/20/2017 02:35 PM  Jetty Duhamellinton Young MD,  ABSM Diplomate, American Board of Sleep Medicine   NPI: [id number]                         Jetty Duhamellinton Young Diplomate, American Board of Sleep Medicine  ELECTRONICALLY SIGNED ON:  03/20/2017, 2:36 PM [facility] PH: [phone]   FX: [phone] ACCREDITED BY THE AMERICAN ACADEMY OF SLEEP MEDICINE

## 2017-04-30 DIAGNOSIS — S30813A Abrasion of scrotum and testes, initial encounter: Secondary | ICD-10-CM | POA: Diagnosis not present

## 2017-04-30 DIAGNOSIS — S3131XA Laceration without foreign body of scrotum and testes, initial encounter: Secondary | ICD-10-CM | POA: Diagnosis not present

## 2017-05-06 DIAGNOSIS — G4733 Obstructive sleep apnea (adult) (pediatric): Secondary | ICD-10-CM | POA: Diagnosis not present

## 2017-05-27 DIAGNOSIS — G4733 Obstructive sleep apnea (adult) (pediatric): Secondary | ICD-10-CM | POA: Diagnosis not present

## 2017-06-05 DIAGNOSIS — G4733 Obstructive sleep apnea (adult) (pediatric): Secondary | ICD-10-CM | POA: Diagnosis not present

## 2017-07-06 DIAGNOSIS — G4733 Obstructive sleep apnea (adult) (pediatric): Secondary | ICD-10-CM | POA: Diagnosis not present

## 2017-08-04 DIAGNOSIS — G4733 Obstructive sleep apnea (adult) (pediatric): Secondary | ICD-10-CM | POA: Diagnosis not present

## 2017-08-05 DIAGNOSIS — G4733 Obstructive sleep apnea (adult) (pediatric): Secondary | ICD-10-CM | POA: Diagnosis not present

## 2017-08-06 DIAGNOSIS — E039 Hypothyroidism, unspecified: Secondary | ICD-10-CM | POA: Diagnosis not present

## 2017-08-06 DIAGNOSIS — E782 Mixed hyperlipidemia: Secondary | ICD-10-CM | POA: Diagnosis not present

## 2017-08-06 DIAGNOSIS — I10 Essential (primary) hypertension: Secondary | ICD-10-CM | POA: Diagnosis not present

## 2017-09-02 DIAGNOSIS — G4733 Obstructive sleep apnea (adult) (pediatric): Secondary | ICD-10-CM | POA: Diagnosis not present

## 2017-09-05 DIAGNOSIS — G4733 Obstructive sleep apnea (adult) (pediatric): Secondary | ICD-10-CM | POA: Diagnosis not present

## 2017-10-06 DIAGNOSIS — G4733 Obstructive sleep apnea (adult) (pediatric): Secondary | ICD-10-CM | POA: Diagnosis not present

## 2017-11-05 DIAGNOSIS — G4733 Obstructive sleep apnea (adult) (pediatric): Secondary | ICD-10-CM | POA: Diagnosis not present

## 2017-12-06 DIAGNOSIS — G4733 Obstructive sleep apnea (adult) (pediatric): Secondary | ICD-10-CM | POA: Diagnosis not present

## 2017-12-08 DIAGNOSIS — G4733 Obstructive sleep apnea (adult) (pediatric): Secondary | ICD-10-CM | POA: Diagnosis not present

## 2018-01-05 DIAGNOSIS — G4733 Obstructive sleep apnea (adult) (pediatric): Secondary | ICD-10-CM | POA: Diagnosis not present

## 2018-01-13 HISTORY — PX: RETINAL DETACHMENT SURGERY: SHX105

## 2018-02-25 DIAGNOSIS — E039 Hypothyroidism, unspecified: Secondary | ICD-10-CM | POA: Diagnosis not present

## 2018-02-25 DIAGNOSIS — Z0000 Encounter for general adult medical examination without abnormal findings: Secondary | ICD-10-CM | POA: Diagnosis not present

## 2018-02-25 DIAGNOSIS — Z23 Encounter for immunization: Secondary | ICD-10-CM | POA: Diagnosis not present

## 2018-02-25 DIAGNOSIS — Z125 Encounter for screening for malignant neoplasm of prostate: Secondary | ICD-10-CM | POA: Diagnosis not present

## 2018-02-25 DIAGNOSIS — I10 Essential (primary) hypertension: Secondary | ICD-10-CM | POA: Diagnosis not present

## 2018-02-25 DIAGNOSIS — E782 Mixed hyperlipidemia: Secondary | ICD-10-CM | POA: Diagnosis not present

## 2018-05-27 DIAGNOSIS — H43811 Vitreous degeneration, right eye: Secondary | ICD-10-CM | POA: Diagnosis not present

## 2018-06-04 DIAGNOSIS — G4733 Obstructive sleep apnea (adult) (pediatric): Secondary | ICD-10-CM | POA: Diagnosis not present

## 2018-06-17 DIAGNOSIS — H43811 Vitreous degeneration, right eye: Secondary | ICD-10-CM | POA: Diagnosis not present

## 2018-06-17 DIAGNOSIS — H35363 Drusen (degenerative) of macula, bilateral: Secondary | ICD-10-CM | POA: Diagnosis not present

## 2018-06-17 DIAGNOSIS — H35372 Puckering of macula, left eye: Secondary | ICD-10-CM | POA: Diagnosis not present

## 2018-06-17 DIAGNOSIS — H33011 Retinal detachment with single break, right eye: Secondary | ICD-10-CM | POA: Diagnosis not present

## 2018-06-18 DIAGNOSIS — H33021 Retinal detachment with multiple breaks, right eye: Secondary | ICD-10-CM | POA: Diagnosis not present

## 2018-06-18 DIAGNOSIS — H4311 Vitreous hemorrhage, right eye: Secondary | ICD-10-CM | POA: Diagnosis not present

## 2018-06-22 DIAGNOSIS — E782 Mixed hyperlipidemia: Secondary | ICD-10-CM | POA: Diagnosis not present

## 2018-08-12 DIAGNOSIS — H33021 Retinal detachment with multiple breaks, right eye: Secondary | ICD-10-CM | POA: Diagnosis not present

## 2018-08-12 DIAGNOSIS — H35411 Lattice degeneration of retina, right eye: Secondary | ICD-10-CM | POA: Diagnosis not present

## 2018-09-02 DIAGNOSIS — E782 Mixed hyperlipidemia: Secondary | ICD-10-CM | POA: Diagnosis not present

## 2018-09-02 DIAGNOSIS — E039 Hypothyroidism, unspecified: Secondary | ICD-10-CM | POA: Diagnosis not present

## 2018-09-02 DIAGNOSIS — Z7189 Other specified counseling: Secondary | ICD-10-CM | POA: Diagnosis not present

## 2018-09-02 DIAGNOSIS — I10 Essential (primary) hypertension: Secondary | ICD-10-CM | POA: Diagnosis not present

## 2018-09-13 DIAGNOSIS — E782 Mixed hyperlipidemia: Secondary | ICD-10-CM | POA: Diagnosis not present

## 2018-09-13 DIAGNOSIS — I10 Essential (primary) hypertension: Secondary | ICD-10-CM | POA: Diagnosis not present

## 2018-09-13 DIAGNOSIS — E039 Hypothyroidism, unspecified: Secondary | ICD-10-CM | POA: Diagnosis not present

## 2018-11-06 DIAGNOSIS — Z23 Encounter for immunization: Secondary | ICD-10-CM | POA: Diagnosis not present

## 2019-03-10 DIAGNOSIS — Z0000 Encounter for general adult medical examination without abnormal findings: Secondary | ICD-10-CM | POA: Diagnosis not present

## 2019-03-16 DIAGNOSIS — E039 Hypothyroidism, unspecified: Secondary | ICD-10-CM | POA: Diagnosis not present

## 2019-03-16 DIAGNOSIS — E782 Mixed hyperlipidemia: Secondary | ICD-10-CM | POA: Diagnosis not present

## 2019-03-16 DIAGNOSIS — Z0000 Encounter for general adult medical examination without abnormal findings: Secondary | ICD-10-CM | POA: Diagnosis not present

## 2019-03-16 DIAGNOSIS — I10 Essential (primary) hypertension: Secondary | ICD-10-CM | POA: Diagnosis not present

## 2019-03-16 DIAGNOSIS — Z23 Encounter for immunization: Secondary | ICD-10-CM | POA: Diagnosis not present

## 2019-03-16 DIAGNOSIS — Z125 Encounter for screening for malignant neoplasm of prostate: Secondary | ICD-10-CM | POA: Diagnosis not present

## 2019-09-09 DIAGNOSIS — E782 Mixed hyperlipidemia: Secondary | ICD-10-CM | POA: Diagnosis not present

## 2019-09-09 DIAGNOSIS — I10 Essential (primary) hypertension: Secondary | ICD-10-CM | POA: Diagnosis not present

## 2019-09-09 DIAGNOSIS — Z720 Tobacco use: Secondary | ICD-10-CM | POA: Diagnosis not present

## 2019-09-09 DIAGNOSIS — E039 Hypothyroidism, unspecified: Secondary | ICD-10-CM | POA: Diagnosis not present

## 2019-12-06 ENCOUNTER — Other Ambulatory Visit: Payer: Self-pay | Admitting: *Deleted

## 2019-12-06 DIAGNOSIS — F17210 Nicotine dependence, cigarettes, uncomplicated: Secondary | ICD-10-CM

## 2020-01-04 ENCOUNTER — Ambulatory Visit (INDEPENDENT_AMBULATORY_CARE_PROVIDER_SITE_OTHER)
Admission: RE | Admit: 2020-01-04 | Discharge: 2020-01-04 | Disposition: A | Discharge status: Home / Self Care | Payer: BC Managed Care – PPO | Source: Ambulatory Visit | Attending: Family Medicine | Admitting: Family Medicine

## 2020-01-04 ENCOUNTER — Encounter: Payer: Self-pay | Admitting: Acute Care

## 2020-01-04 ENCOUNTER — Ambulatory Visit (INDEPENDENT_AMBULATORY_CARE_PROVIDER_SITE_OTHER): Payer: BC Managed Care – PPO | Admitting: Acute Care

## 2020-01-04 ENCOUNTER — Other Ambulatory Visit: Payer: Self-pay

## 2020-01-04 VITALS — BP 122/84 | HR 74 | Temp 97.6°F | Ht 68.0 in | Wt 171.2 lb

## 2020-01-04 DIAGNOSIS — F17210 Nicotine dependence, cigarettes, uncomplicated: Secondary | ICD-10-CM

## 2020-01-04 DIAGNOSIS — Z122 Encounter for screening for malignant neoplasm of respiratory organs: Secondary | ICD-10-CM

## 2020-01-04 NOTE — Patient Instructions (Signed)
Thank you for participating in the [facility]. It was our pleasure to meet you today. We will call you with the results of your scan within the next few days. Your scan will be assigned a Lung RADS category score by the physicians reading the scans.  This Lung RADS score determines follow up scanning.  See below for description of categories, and follow up screening recommendations. We will be in touch to schedule your follow up screening annually or based on recommendations of our providers. We will fax a copy of your scan results to your Primary Care Physician, or the physician who referred you to the program, to ensure they have the results. Please call the office if you have any questions or concerns regarding your scanning experience or results.  Our office number is [phone]. Please speak with Denise Phelps, RN. She is our Lung Cancer Screening RN. If she is unavailable when you call, please have the office staff send her a message. She will return your call at her earliest convenience. Remember, if your scan is normal, we will scan you annually as long as you continue to meet the criteria for the program. (Age 55-77, Current smoker or smoker who has quit within the last 15 years). If you are a smoker, remember, quitting is the single most powerful action that you can take to decrease your risk of lung cancer and other pulmonary, breathing related problems. We know quitting is hard, and we are here to help.  Please let us know if there is anything we can do to help you meet your goal of quitting. If you are a former smoker, congratulations. We are proud of you! Remain smoke free! Remember you can refer friends or family members through the number above.  We will screen them to make sure they meet criteria for the program. Thank you for helping us take better care of you by participating in Lung Screening.  Lung RADS Categories:  Lung RADS 1: no nodules  or definitely non-concerning nodules.  Recommendation is for a repeat annual scan in 12 months.  Lung RADS 2:  nodules that are non-concerning in appearance and behavior with a very low likelihood of becoming an active cancer. Recommendation is for a repeat annual scan in 12 months.  Lung RADS 3: nodules that are probably non-concerning , includes nodules with a low likelihood of becoming an active cancer.  Recommendation is for a 6-month repeat screening scan. Often noted after an upper respiratory illness. We will be in touch to make sure you have no questions, and to schedule your 6-month scan.  Lung RADS 4 A: nodules with concerning findings, recommendation is most often for a follow up scan in 3 months or additional testing based on our provider's assessment of the scan. We will be in touch to make sure you have no questions and to schedule the recommended 3 month follow up scan.  Lung RADS 4 B:  indicates findings that are concerning. We will be in touch with you to schedule additional diagnostic testing based on our provider's  assessment of the scan.   

## 2020-01-04 NOTE — Progress Notes (Signed)
Shared Decision Making Visit Lung Cancer Screening Program [phone])   Eligibility:  Age 64 y.o.  Pack Years Smoking History Calculation 68 pack year smoking history (# packs/per year x # years smoked)  Recent History of coughing up blood  no  Unexplained weight loss? no ( >Than 15 pounds within the last 6 months )  Prior History Lung / other cancer no (Diagnosis within the last 5 years already requiring surveillance chest CT Scans).  Smoking Status Current Smoker  Former Smokers: Years since quit: NA  Quit Date: NA  Visit Components:  Discussion included one or more decision making aids. yes  Discussion included risk/benefits of screening. yes  Discussion included potential follow up diagnostic testing for abnormal scans. yes  Discussion included meaning and risk of over diagnosis. yes  Discussion included meaning and risk of False Positives. yes  Discussion included meaning of total radiation exposure. yes  Counseling Included:  Importance of adherence to annual lung cancer LDCT screening. yes  Impact of comorbidities on ability to participate in the program. yes  Ability and willingness to under diagnostic treatment. yes  Smoking Cessation Counseling:  Current Smokers:   Discussed importance of smoking cessation. yes  Information about tobacco cessation classes and interventions provided to patient. yes  Patient provided with "ticket" for LDCT Scan. yes  Symptomatic Patient. no  Counseling NA  Diagnosis Code: Tobacco Use Z72.0  Asymptomatic Patient yes  Counseling (Intermediate counseling: > three minutes counseling) O1157  Former Smokers:   Discussed the importance of maintaining cigarette abstinence. yes  Diagnosis Code: Personal History of Nicotine Dependence. W62.035  Information about tobacco cessation classes and interventions provided to patient. Yes  Patient provided with "ticket" for LDCT Scan. yes  Written Order for Lung Cancer  Screening with LDCT placed in Epic. Yes (CT Chest Lung Cancer Screening Low Dose W/O CM) [id number] Z12.2-Screening of respiratory organs Z87.891-Personal history of nicotine dependence  BP 122/84 (BP Location: Left Arm, Cuff Size: Normal)   Pulse 74   Temp 97.6 F (36.4 C) (Oral)   Ht 5\' 8"  (1.727 m)   Wt 171 lb 3.2 oz (77.7 kg)   SpO2 97%   BMI 26.03 kg/m    I have spent 25 minutes of face to face time with Mr. Hemann discussing the risks and benefits of lung cancer screening. We viewed a power point together that explained in detail the above noted topics. We paused at intervals to allow for questions to be asked and answered to ensure understanding.We discussed that the single most powerful action that she can take to decrease her risk of developing lung cancer is to quit smoking. We discussed whether or not she is ready to commit to setting a quit date. We discussed options for tools to aid in quitting smoking including nicotine replacement therapy, non-nicotine medications, support groups, Quit Smart classes, and behavior modification. We discussed that often times setting smaller, more achievable goals, such as eliminating 1 cigarette a day for a week and then 2 cigarettes a day for a week can be helpful in slowly decreasing the number of cigarettes smoked. This allows for a sense of accomplishment as well as providing a clinical benefit. I gave her the " Be Stronger Than Your Excuses" card with contact information for community resources, classes, free nicotine replacement therapy, and access to mobile apps, text messaging, and on-line smoking cessation help. I have also given her my card and contact information in the event she needs to contact me. We discussed  the time and location of the scan, and that either Abigail Miyamoto RN or I will call with the results within 24-48 hours of receiving them. I have offered her  a copy of the power point we viewed  as a resource in the event they need  reinforcement of the concepts we discussed today in the office. The patient verbalized understanding of all of  the above and had no further questions upon leaving the office. They have my contact information in the event they have any further questions.  I spent 3 minutes counseling on smoking cessation and the health risks of continued tobacco abuse.  I explained to the patient that there has been a high incidence of coronary artery disease noted on these exams. I explained that this is a non-gated exam therefore degree or severity cannot be determined. This patient is currently on statin therapy. I have asked the patient to follow-up with their PCP regarding any incidental finding of coronary artery disease and management with diet or medication as their PCP  feels is clinically indicated. The patient verbalized understanding of the above and had no further questions upon completion of the visit.      Bevelyn Ngo, NP 01/04/2020 10:30 AM

## 2020-01-17 ENCOUNTER — Other Ambulatory Visit: Payer: Self-pay | Admitting: *Deleted

## 2020-01-17 DIAGNOSIS — F17210 Nicotine dependence, cigarettes, uncomplicated: Secondary | ICD-10-CM

## 2020-01-17 NOTE — Progress Notes (Signed)
Please call patient and let them  know their  low dose Ct was read as a Lung RADS 1, negative study: no nodules or definitely benign nodules. Radiology recommendation is for a repeat LDCT in 12 months. .Please let them  know we will order and schedule their  annual screening scan for 12/2021. Please let them  know there was notation of CAD on their  scan.  Please remind the patient  that this is a non-gated exam therefore degree or severity of disease  cannot be determined. Please have them  follow up with their PCP regarding potential risk factor modification, dietary therapy or pharmacologic therapy if clinically indicated. Pt.  is  currently on statin therapy. Please place order for annual  screening scan for  12/2021 and fax results to PCP. Thanks so much. 

## 2020-03-23 DIAGNOSIS — I10 Essential (primary) hypertension: Secondary | ICD-10-CM | POA: Diagnosis not present

## 2020-03-23 DIAGNOSIS — E039 Hypothyroidism, unspecified: Secondary | ICD-10-CM | POA: Diagnosis not present

## 2020-03-23 DIAGNOSIS — Z0000 Encounter for general adult medical examination without abnormal findings: Secondary | ICD-10-CM | POA: Diagnosis not present

## 2020-03-23 DIAGNOSIS — Z125 Encounter for screening for malignant neoplasm of prostate: Secondary | ICD-10-CM | POA: Diagnosis not present

## 2020-03-23 DIAGNOSIS — E782 Mixed hyperlipidemia: Secondary | ICD-10-CM | POA: Diagnosis not present

## 2020-09-27 DIAGNOSIS — I10 Essential (primary) hypertension: Secondary | ICD-10-CM | POA: Diagnosis not present

## 2020-09-27 DIAGNOSIS — E782 Mixed hyperlipidemia: Secondary | ICD-10-CM | POA: Diagnosis not present

## 2020-09-27 DIAGNOSIS — Z23 Encounter for immunization: Secondary | ICD-10-CM | POA: Diagnosis not present

## 2020-09-27 DIAGNOSIS — Z720 Tobacco use: Secondary | ICD-10-CM | POA: Diagnosis not present

## 2020-09-27 DIAGNOSIS — E039 Hypothyroidism, unspecified: Secondary | ICD-10-CM | POA: Diagnosis not present

## 2020-11-24 ENCOUNTER — Inpatient Hospital Stay (HOSPITAL_COMMUNITY)
Admission: EM | Admit: 2020-11-24 | Discharge: 2020-11-27 | DRG: 247 | Disposition: A | Discharge status: Home / Self Care | Payer: BC Managed Care – PPO | Attending: Internal Medicine | Admitting: Internal Medicine

## 2020-11-24 ENCOUNTER — Other Ambulatory Visit: Payer: Self-pay

## 2020-11-24 ENCOUNTER — Emergency Department (HOSPITAL_COMMUNITY): Payer: BC Managed Care – PPO

## 2020-11-24 ENCOUNTER — Encounter (HOSPITAL_COMMUNITY): Payer: Self-pay | Admitting: Emergency Medicine

## 2020-11-24 DIAGNOSIS — I214 Non-ST elevation (NSTEMI) myocardial infarction: Secondary | ICD-10-CM | POA: Diagnosis not present

## 2020-11-24 DIAGNOSIS — R079 Chest pain, unspecified: Secondary | ICD-10-CM | POA: Diagnosis not present

## 2020-11-24 DIAGNOSIS — E782 Mixed hyperlipidemia: Secondary | ICD-10-CM | POA: Diagnosis present

## 2020-11-24 DIAGNOSIS — Z720 Tobacco use: Secondary | ICD-10-CM | POA: Diagnosis not present

## 2020-11-24 DIAGNOSIS — I10 Essential (primary) hypertension: Secondary | ICD-10-CM | POA: Diagnosis present

## 2020-11-24 DIAGNOSIS — Z79899 Other long term (current) drug therapy: Secondary | ICD-10-CM

## 2020-11-24 DIAGNOSIS — E890 Postprocedural hypothyroidism: Secondary | ICD-10-CM | POA: Diagnosis not present

## 2020-11-24 DIAGNOSIS — F17210 Nicotine dependence, cigarettes, uncomplicated: Secondary | ICD-10-CM | POA: Diagnosis not present

## 2020-11-24 DIAGNOSIS — Z79890 Hormone replacement therapy (postmenopausal): Secondary | ICD-10-CM | POA: Diagnosis not present

## 2020-11-24 DIAGNOSIS — G4733 Obstructive sleep apnea (adult) (pediatric): Secondary | ICD-10-CM | POA: Diagnosis not present

## 2020-11-24 DIAGNOSIS — I2510 Atherosclerotic heart disease of native coronary artery without angina pectoris: Secondary | ICD-10-CM | POA: Diagnosis not present

## 2020-11-24 DIAGNOSIS — R0602 Shortness of breath: Secondary | ICD-10-CM | POA: Diagnosis not present

## 2020-11-24 DIAGNOSIS — R7303 Prediabetes: Secondary | ICD-10-CM | POA: Diagnosis not present

## 2020-11-24 DIAGNOSIS — I25110 Atherosclerotic heart disease of native coronary artery with unstable angina pectoris: Secondary | ICD-10-CM | POA: Diagnosis present

## 2020-11-24 DIAGNOSIS — Z20822 Contact with and (suspected) exposure to covid-19: Secondary | ICD-10-CM | POA: Diagnosis present

## 2020-11-24 DIAGNOSIS — I25119 Atherosclerotic heart disease of native coronary artery with unspecified angina pectoris: Secondary | ICD-10-CM

## 2020-11-24 NOTE — ED Provider Notes (Signed)
Emergency Medicine Provider Triage Evaluation Note  John Randolph , a 65 y.o. male  was evaluated in triage.  Pt complains of gradual onset, constant, sharp, left sided chest pain that began about 1.5 hours ago.  He also complains of a tingling sensation to his left upper arm.  He denies any shortness of breath, nausea, vomiting, diaphoresis.  Patient is a current every day smoker.  He states that he thought at first that he was having acid reflux and took 2 Tums without relief prompting ED visit.  He denies any history of MI in the past.  He denies any family history of same.  Denies any radiation of the chest pain into his jaw or through to his back.   Review of Systems  Positive: + CP, tingling in L arm Negative: - weakness, numbness, SOB, nausea, vomiting  Physical Exam  BP (!) 164/103   Pulse (!) 101   Temp 97.9 F (36.6 C) (Oral)   Resp 16   SpO2 98%  Gen:   Awake, no distress   Resp:  Normal effort  MSK:   Moves extremities without difficulty  Other:  RRR. Equal grip strength bilaterally. 2+ radial pulses bilaterally.   Medical Decision Making  Medically screening exam initiated at 11:31 PM.  Appropriate orders placed.  John Randolph was informed that the remainder of the evaluation will be completed by another provider, this initial triage assessment does not replace that evaluation, and the importance of remaining in the ED until their evaluation is complete.     Tanda Rockers, PA-C 11/24/20 2334    Terald Sleeper, MD 11/25/20 [phone]

## 2020-11-24 NOTE — ED Triage Notes (Signed)
Pt has had pain across his chest for an hour and a half not responsive to Tums.  Reports his left arm feeling "like when you hit your funny bone"   No n/v or shob.

## 2020-11-25 ENCOUNTER — Encounter (HOSPITAL_COMMUNITY): Payer: Self-pay | Admitting: Internal Medicine

## 2020-11-25 DIAGNOSIS — G4733 Obstructive sleep apnea (adult) (pediatric): Secondary | ICD-10-CM | POA: Diagnosis present

## 2020-11-25 DIAGNOSIS — E890 Postprocedural hypothyroidism: Secondary | ICD-10-CM | POA: Diagnosis present

## 2020-11-25 DIAGNOSIS — I25119 Atherosclerotic heart disease of native coronary artery with unspecified angina pectoris: Secondary | ICD-10-CM | POA: Diagnosis present

## 2020-11-25 DIAGNOSIS — Z720 Tobacco use: Secondary | ICD-10-CM | POA: Diagnosis present

## 2020-11-25 DIAGNOSIS — R7303 Prediabetes: Secondary | ICD-10-CM | POA: Diagnosis present

## 2020-11-25 DIAGNOSIS — Z79890 Hormone replacement therapy (postmenopausal): Secondary | ICD-10-CM | POA: Diagnosis not present

## 2020-11-25 DIAGNOSIS — I10 Essential (primary) hypertension: Secondary | ICD-10-CM | POA: Diagnosis present

## 2020-11-25 DIAGNOSIS — I25110 Atherosclerotic heart disease of native coronary artery with unstable angina pectoris: Secondary | ICD-10-CM | POA: Diagnosis present

## 2020-11-25 DIAGNOSIS — Z79899 Other long term (current) drug therapy: Secondary | ICD-10-CM | POA: Diagnosis not present

## 2020-11-25 DIAGNOSIS — E782 Mixed hyperlipidemia: Secondary | ICD-10-CM | POA: Diagnosis present

## 2020-11-25 DIAGNOSIS — F17210 Nicotine dependence, cigarettes, uncomplicated: Secondary | ICD-10-CM | POA: Diagnosis present

## 2020-11-25 DIAGNOSIS — I214 Non-ST elevation (NSTEMI) myocardial infarction: Principal | ICD-10-CM

## 2020-11-25 DIAGNOSIS — I2510 Atherosclerotic heart disease of native coronary artery without angina pectoris: Secondary | ICD-10-CM | POA: Diagnosis not present

## 2020-11-25 DIAGNOSIS — Z20822 Contact with and (suspected) exposure to covid-19: Secondary | ICD-10-CM | POA: Diagnosis present

## 2020-11-25 LAB — CBC WITH DIFFERENTIAL/PLATELET
Abs Immature Granulocytes: 0.03 10*3/uL (ref 0.00–0.07)
Basophils Absolute: 0.1 10*3/uL (ref 0.0–0.1)
Basophils Relative: 1 %
Eosinophils Absolute: 0.1 10*3/uL (ref 0.0–0.5)
Eosinophils Relative: 1 %
HCT: 46 % (ref 39.0–52.0)
Hemoglobin: 16.2 g/dL (ref 13.0–17.0)
Immature Granulocytes: 0 %
Lymphocytes Relative: 20 %
Lymphs Abs: 2.1 10*3/uL (ref 0.7–4.0)
MCH: 31.2 pg (ref 26.0–34.0)
MCHC: 35.2 g/dL (ref 30.0–36.0)
MCV: 88.6 fL (ref 80.0–100.0)
Monocytes Absolute: 0.9 10*3/uL (ref 0.1–1.0)
Monocytes Relative: 9 %
Neutro Abs: 7.3 10*3/uL (ref 1.7–7.7)
Neutrophils Relative %: 69 %
Platelets: 239 10*3/uL (ref 150–400)
RBC: 5.19 MIL/uL (ref 4.22–5.81)
RDW: 13.6 % (ref 11.5–15.5)
WBC: 10.5 10*3/uL (ref 4.0–10.5)
nRBC: 0 % (ref 0.0–0.2)

## 2020-11-25 LAB — LIPID PANEL
Cholesterol: 234 mg/dL — ABNORMAL HIGH (ref 0–200)
HDL: 38 mg/dL — ABNORMAL LOW (ref >40)
LDL Cholesterol: 147 mg/dL — ABNORMAL HIGH (ref 0–99)
Total CHOL/HDL Ratio: 6.2 RATIO
Triglycerides: 247 mg/dL — ABNORMAL HIGH (ref <150)
VLDL: 49 mg/dL — ABNORMAL HIGH (ref 0–40)

## 2020-11-25 LAB — APTT: aPTT: 32 seconds (ref 24–36)

## 2020-11-25 LAB — CBC
HCT: 45.6 % (ref 39.0–52.0)
Hemoglobin: 16 g/dL (ref 13.0–17.0)
MCH: 31.1 pg (ref 26.0–34.0)
MCHC: 35.1 g/dL (ref 30.0–36.0)
MCV: 88.7 fL (ref 80.0–100.0)
Platelets: 213 10*3/uL (ref 150–400)
RBC: 5.14 MIL/uL (ref 4.22–5.81)
RDW: 13.7 % (ref 11.5–15.5)
WBC: 8.2 10*3/uL (ref 4.0–10.5)
nRBC: 0 % (ref 0.0–0.2)

## 2020-11-25 LAB — COMPREHENSIVE METABOLIC PANEL
ALT: 16 U/L (ref 0–44)
AST: 33 U/L (ref 15–41)
Albumin: 3.9 g/dL (ref 3.5–5.0)
Alkaline Phosphatase: 80 U/L (ref 38–126)
Anion gap: 7 (ref 5–15)
BUN: 13 mg/dL (ref 8–23)
CO2: 25 mmol/L (ref 22–32)
Calcium: 8.8 mg/dL — ABNORMAL LOW (ref 8.9–10.3)
Chloride: 103 mmol/L (ref 98–111)
Creatinine, Ser: 1 mg/dL (ref 0.61–1.24)
GFR, Estimated: >60 mL/min (ref >60)
Glucose, Bld: 112 mg/dL — ABNORMAL HIGH (ref 70–99)
Potassium: 3.2 mmol/L — ABNORMAL LOW (ref 3.5–5.1)
Sodium: 135 mmol/L (ref 135–145)
Total Bilirubin: 0.6 mg/dL (ref 0.3–1.2)
Total Protein: 7 g/dL (ref 6.5–8.1)

## 2020-11-25 LAB — PROTIME-INR
INR: 1.1 (ref 0.8–1.2)
Prothrombin Time: 14.5 seconds (ref 11.4–15.2)

## 2020-11-25 LAB — BASIC METABOLIC PANEL
Anion gap: 9 (ref 5–15)
BUN: 11 mg/dL (ref 8–23)
CO2: 24 mmol/L (ref 22–32)
Calcium: 9.1 mg/dL (ref 8.9–10.3)
Chloride: 103 mmol/L (ref 98–111)
Creatinine, Ser: 0.88 mg/dL (ref 0.61–1.24)
GFR, Estimated: >60 mL/min (ref >60)
Glucose, Bld: 108 mg/dL — ABNORMAL HIGH (ref 70–99)
Potassium: 3.5 mmol/L (ref 3.5–5.1)
Sodium: 136 mmol/L (ref 135–145)

## 2020-11-25 LAB — HEPARIN LEVEL (UNFRACTIONATED)
Heparin Unfractionated: 0.34 IU/mL (ref 0.30–0.70)
Heparin Unfractionated: 0.28 IU/mL — ABNORMAL LOW (ref 0.30–0.70)
Heparin Unfractionated: 0.26 IU/mL — ABNORMAL LOW (ref 0.30–0.70)

## 2020-11-25 LAB — BRAIN NATRIURETIC PEPTIDE: B Natriuretic Peptide: 76.3 pg/mL (ref 0.0–100.0)

## 2020-11-25 LAB — TROPONIN I (HIGH SENSITIVITY)
Troponin I (High Sensitivity): 1050 ng/L (ref <18)
Troponin I (High Sensitivity): 1273 ng/L (ref <18)

## 2020-11-25 LAB — HEMOGLOBIN A1C
Hgb A1c MFr Bld: 5.8 % — ABNORMAL HIGH (ref 4.8–5.6)
Mean Plasma Glucose: 119.76 mg/dL

## 2020-11-25 LAB — RESP PANEL BY RT-PCR (FLU A&B, COVID) ARPGX2
Influenza A by PCR: NEGATIVE
Influenza B by PCR: NEGATIVE
SARS Coronavirus 2 by RT PCR: NEGATIVE

## 2020-11-25 LAB — TSH: TSH: 1.676 u[IU]/mL (ref 0.350–4.500)

## 2020-11-25 LAB — HIV ANTIBODY (ROUTINE TESTING W REFLEX): HIV Screen 4th Generation wRfx: NONREACTIVE

## 2020-11-25 LAB — T4, FREE: Free T4: 1.02 ng/dL (ref 0.61–1.12)

## 2020-11-25 MED ORDER — ASPIRIN EC 81 MG PO TBEC
81.0000 mg | DELAYED_RELEASE_TABLET | Freq: Every day | ORAL | Status: DC
  Administered 2020-11-26 – 2020-11-27 (×2): 81 mg via ORAL
  Filled 2020-11-25 (×2): qty 1

## 2020-11-25 MED ORDER — ONDANSETRON HCL 4 MG/2ML IJ SOLN: 4.0000 mg | Freq: Four times a day (QID) | INTRAMUSCULAR | Status: DC | PRN

## 2020-11-25 MED ORDER — LEVOTHYROXINE SODIUM 100 MCG PO TABS
100.0000 ug | ORAL_TABLET | Freq: Every day | ORAL | Status: DC
  Administered 2020-11-25 – 2020-11-27 (×3): 100 ug via ORAL
  Filled 2020-11-25 (×3): qty 1

## 2020-11-25 MED ORDER — LISINOPRIL 5 MG PO TABS
5.0000 mg | ORAL_TABLET | Freq: Every day | ORAL | Status: DC
  Administered 2020-11-25 – 2020-11-26 (×2): 5 mg via ORAL
  Filled 2020-11-25 (×2): qty 1

## 2020-11-25 MED ORDER — NITROGLYCERIN IN D5W 200-5 MCG/ML-% IV SOLN
0.0000 ug/min | INTRAVENOUS | Status: DC
  Administered 2020-11-25: 5 ug/min via INTRAVENOUS
  Filled 2020-11-25: qty 250

## 2020-11-25 MED ORDER — POTASSIUM CHLORIDE CRYS ER 10 MEQ PO TBCR
40.0000 meq | EXTENDED_RELEASE_TABLET | Freq: Once | ORAL | Status: AC
  Administered 2020-11-25: 40 meq via ORAL
  Filled 2020-11-25 (×2): qty 4

## 2020-11-25 MED ORDER — ATORVASTATIN CALCIUM 40 MG PO TABS: 80.0000 mg | ORAL_TABLET | Freq: Every day | ORAL | Status: DC

## 2020-11-25 MED ORDER — HEPARIN BOLUS VIA INFUSION
4000.0000 [IU] | Freq: Once | INTRAVENOUS | Status: AC
  Administered 2020-11-25: 4000 [IU] via INTRAVENOUS
  Filled 2020-11-25: qty 4000

## 2020-11-25 MED ORDER — ACETAMINOPHEN 325 MG PO TABS: 650.0000 mg | ORAL_TABLET | ORAL | Status: DC | PRN

## 2020-11-25 MED ORDER — HEPARIN (PORCINE) 25000 UT/250ML-% IV SOLN
1300.0000 [IU]/h | INTRAVENOUS | Status: DC
  Administered 2020-11-25: 1150 [IU]/h via INTRAVENOUS
  Administered 2020-11-25: 1000 [IU]/h via INTRAVENOUS
  Filled 2020-11-25 (×2): qty 250

## 2020-11-25 MED ORDER — CAPTOPRIL 12.5 MG PO TABS
6.2500 mg | ORAL_TABLET | Freq: Three times a day (TID) | ORAL | Status: DC
  Administered 2020-11-25 (×2): 6.25 mg via ORAL
  Filled 2020-11-25 (×2): qty 0.5

## 2020-11-25 MED ORDER — ATORVASTATIN CALCIUM 80 MG PO TABS
80.0000 mg | ORAL_TABLET | Freq: Every day | ORAL | Status: DC
  Administered 2020-11-25 – 2020-11-27 (×3): 80 mg via ORAL
  Filled 2020-11-25: qty 2
  Filled 2020-11-25 (×2): qty 1

## 2020-11-25 MED ORDER — TICAGRELOR 90 MG PO TABS
90.0000 mg | ORAL_TABLET | Freq: Two times a day (BID) | ORAL | Status: DC
  Administered 2020-11-25 – 2020-11-27 (×5): 90 mg via ORAL
  Filled 2020-11-25 (×5): qty 1

## 2020-11-25 MED ORDER — NITROGLYCERIN 0.4 MG SL SUBL: 0.4000 mg | SUBLINGUAL_TABLET | SUBLINGUAL | Status: DC | PRN

## 2020-11-25 MED ORDER — NICOTINE POLACRILEX 2 MG MT GUM
2.0000 mg | CHEWING_GUM | OROMUCOSAL | Status: DC | PRN
  Filled 2020-11-25: qty 1

## 2020-11-25 MED ORDER — TICAGRELOR 90 MG PO TABS
180.0000 mg | ORAL_TABLET | Freq: Once | ORAL | Status: AC
  Administered 2020-11-25: 180 mg via ORAL
  Filled 2020-11-25: qty 2

## 2020-11-25 MED ORDER — ASPIRIN 81 MG PO CHEW
324.0000 mg | CHEWABLE_TABLET | Freq: Once | ORAL | Status: AC
  Administered 2020-11-25: 324 mg via ORAL
  Filled 2020-11-25: qty 4

## 2020-11-25 MED ORDER — CAPTOPRIL 12.5 MG PO TABS: 6.2500 mg | ORAL_TABLET | Freq: Three times a day (TID) | ORAL | Status: DC

## 2020-11-25 MED ORDER — METOPROLOL TARTRATE 12.5 MG HALF TABLET
12.5000 mg | ORAL_TABLET | Freq: Two times a day (BID) | ORAL | Status: DC
  Administered 2020-11-25 – 2020-11-26 (×5): 12.5 mg via ORAL
  Filled 2020-11-25 (×5): qty 1

## 2020-11-25 MED ORDER — SODIUM CHLORIDE 0.9 % IV SOLN: INTRAVENOUS | Status: DC

## 2020-11-25 MED ORDER — HEPARIN SODIUM (PORCINE) 5000 UNIT/ML IJ SOLN: 60.0000 [IU]/kg | Freq: Once | INTRAMUSCULAR | Status: DC

## 2020-11-25 NOTE — Assessment & Plan Note (Signed)
Coronary artery disease/calcified plaque was noted on prior lung cancer screening.  Awaiting heart catheterization.  Subtle aVR elevation, depression somewhat diffusely, could have triple-vessel disease.

## 2020-11-25 NOTE — H&P (Signed)
Cardiology Admission History and Physical:   Patient ID: John Randolph MRN: [id number]; DOB: Feb 18, 1955   Admission date: 11/24/2020  PCP:  Lupita Raider, MD   [facility] Providers Cardiologist:  None   {  Chief Complaint:  "I thought I was havin indigestion"  Patient Profile:   John Randolph is a 65 y.o. male with hx of hyperlipidemia, OSA on CPAP (uses ~ once/week), hypothyroidism, and tobacco abuse who is being seen 11/25/2020 for the evaluation of NSTEMI.  History of Present Illness:   Mr. Tallon has a known history of hyperlipidemia for which she takes pravastatin.  He previously reported muscle aches with atorvastatin and rosuvastatin.  While there have been no clinical manifestations of coronary artery disease, he recently had a lung cancer screening CT that did note coronary calcifications in the distribution of the LAD and left circumflex.  He is smoking about 2 packs/day of [place] whites.  He previously smoked [place] Nora reds and in an effort to quit switched to [facility] because they have less nicotine.  With this change he has noticed that he is actually smoking more, but does desire to quit.  Overall Mr. John Randolph tells me until yesterday evening he has been feeling well.  For the last 6 months or so he has often been getting a burning chest discomfort after eating dinner, but this always resolves within 10 minutes after taking a couple of Tums.  He has never had this "indigestion" feeling when exerting himself and it has always resolved with Tums.  This evening around 930 he again developed a burning chest discomfort, but this time he felt it was much more intense.  He also noticed some left arm pain shortly after this chest discomfort began.  He again tried to take some Tums but the discomfort did not resolve.  He denies any associated shortness of breath, nausea, diaphoresis.  Given the significant change in his symptoms he called 911 and was brought to the emergency  department.  On arrival to the emergency department he was hemodynamically stable.  His EKG was notable for relatively diffuse ST depressions.  His troponin was elevated at over thousand.  He continued to have chest discomfort in the emergency department and it largely resolved with sublingual nitro.  He was started on a nitroglycerin drip at 5 mcgs, and by the time I saw him he was completely chest pain-free.   Past Medical History:  Diagnosis Date   Hyperlipidemia    Hypertension    Hypothyroidism    Sleep apnea    DX years ago but never used CPAP   Thyroid disease    hypothyroidism    Past Surgical History:  Procedure Laterality Date   LAPAROSCOPIC INGUINAL HERNIA WITH UMBILICAL HERNIA N/A 03/12/2017   Procedure: LAPAROSCOPIC BILATERAL  INGUINAL HERNIA REPAIR WITH MESH AND OPEN UMBILICAL HERNIA REPAIR;  Surgeon: Sheliah Hatch, De Blanch, MD;  Location: [facility];  Service: General;  Laterality: N/A;   THYROIDECTOMY       Medications Prior to Admission: Prior to Admission medications   Medication Sig Start Date End Date Taking? Authorizing Provider  levothyroxine (SYNTHROID, LEVOTHROID) 100 MCG tablet Take 100 mcg by mouth daily before breakfast.   Yes [provider]  LORazepam (ATIVAN) 0.5 MG tablet Take 0.5 mg by mouth at bedtime as needed for sleep.   Yes [provider]  pravastatin (PRAVACHOL) 80 MG tablet Take 80 mg by mouth daily at 6 PM.   Yes [provider]  tadalafil (  CIALIS) 5 MG tablet Take 5 mg by mouth daily as needed for erectile dysfunction. 09/12/20  Yes [provider]     Allergies:    Allergies  Allergen Reactions   Crestor [Rosuvastatin] Other (See Comments)    Aches    Hydrochlorothiazide Other (See Comments)   Lipitor [Atorvastatin] Other (See Comments)    Aches    Other Other (See Comments)   Zocor [Simvastatin] Other (See Comments)    aches    Social History:   Social History   Socioeconomic History   Marital  status: Widowed    Spouse name: Not on file   Number of children: Not on file   Years of education: Not on file   Highest education level: Not on file  Occupational History   Not on file  Tobacco Use   Smoking status: Every Day    Packs/day: 1.50    Years: 30.00    Pack years: 45.00    Types: Cigarettes   Smokeless tobacco: Never  Vaping Use   Vaping Use: Never used  Substance and Sexual Activity   Alcohol use: Yes    Comment: occassionally   Drug use: No   Sexual activity: Not on file  Other Topics Concern   Not on file  Social History Narrative   Not on file   Social Determinants of Health   Financial Resource Strain: Not on file  Food Insecurity: Not on file  Transportation Needs: Not on file  Physical Activity: Not on file  Stress: Not on file  Social Connections: Not on file  Intimate Partner Violence: Not on file    Family History:   The patient's family history is not on file.    ROS:  Please see the history of present illness.  All other ROS reviewed and negative.     Physical Exam/Data:   Vitals:   11/24/20 2323 11/25/20 0053 11/25/20 0218  BP: (!) 164/103 (!) 149/94 (!) 146/81  Pulse: (!) 101 81 78  Resp: 16 16 20   Temp: 97.9 F (36.6 C)    TempSrc: Oral    SpO2: 98% 96% 100%   No intake or output data in the 24 hours ending 11/25/20 0342 Last 3 Weights 01/04/2020 03/16/2017 03/12/2017  Weight (lbs) 171 lb 3.2 oz 171 lb 171 lb  Weight (kg) 77.656 kg 77.565 kg 77.565 kg     There is no height or weight on file to calculate BMI.  General:  Well nourished, well developed, in no acute distress HEENT: normal Neck: no JVD Vascular: No carotid bruits; Distal pulses 2+ bilaterally   Cardiac:  normal S1, S2; RRR; no murmur  Lungs:  clear to auscultation bilaterally, no wheezing, rhonchi or rales  Abd: soft, nontender, no hepatomegaly  Ext: no edema Musculoskeletal:  No deformities, BUE and BLE strength normal and equal Skin: warm and dry  Neuro:   CNs 2-12 intact, no focal abnormalities noted Psych:  Normal affect   EKG:  The ECG that was done 11/25/20 was personally reviewed and demonstrates diffuse, horizontal an downsloping ST depression (with possible slight ST elevation in avR)  Relevant CV Studies: None  Laboratory Data:  High Sensitivity Troponin:   Recent Labs  Lab 11/24/20 2352 11/25/20 0152  TROPONINIHS 1,050* 1,273*      Chemistry Recent Labs  Lab 11/24/20 2352  NA 135  K 3.2*  CL 103  CO2 25  GLUCOSE 112*  BUN 13  CREATININE 1.00  CALCIUM 8.8*  GFRNONAA >60  ANIONGAP 7    Recent Labs  Lab 11/24/20 2352  PROT 7.0  ALBUMIN 3.9  AST 33  ALT 16  ALKPHOS 80  BILITOT 0.6   Lipids  Recent Labs  Lab 11/24/20 2352  CHOL 234*  TRIG 247*  HDL 38*  LDLCALC 147*  CHOLHDL 6.2   Hematology Recent Labs  Lab 11/24/20 2352  WBC 10.5  RBC 5.19  HGB 16.2  HCT 46.0  MCV 88.6  MCH 31.2  MCHC 35.2  RDW 13.6  PLT 239   Thyroid No results for input(s): TSH, FREET4 in the last 168 hours. BNPNo results for input(s): BNP, PROBNP in the last 168 hours.  DDimer No results for input(s): DDIMER in the last 168 hours.   Radiology/Studies:  DG Chest 2 View  Result Date: 11/25/2020 CLINICAL DATA:  Chest pain.  Shortness of breath. EXAM: CHEST - 2 VIEW COMPARISON:  Chest CT 01/04/2020 FINDINGS: The heart is normal in size. Aortic atherosclerosis and tortuosity. Streaky bandlike opacity in the right middle lobe. Mild bronchial thickening. Calcified granuloma in the right mid lung zone. No pulmonary edema, pleural effusion, or pneumothorax. No acute osseous abnormalities are seen. IMPRESSION: 1. Streaky bandlike right middle lobe opacity, favor atelectasis. 2. Mild bronchial thickening, chronic. Electronically Signed   By: Narda Rutherford M.D.   On: 11/25/2020 00:00     Assessment and Plan:   #NSTEMI: - Presented after sudden onset of burning chest discomfort that did not resolve with Tums.  Hemodynamically stable on arrival. EKG with relatively diffuse ST depression and positive troponin at > 1000. Chest pain free after SL NTG - Mr. Robarts has multiple risk factors and is an active smoker. He also was recently noted to have coronary calcifications on a lung CA screening CT - Start heparin ggt - Will load with ASA + brillinta given time to diagnostic angiography will be > 24 hours. ASA 81 mg + ticag 90 mg BID to start in the AM - Start metoprolol 12.5 mg BID - Will also start captopril for BP control (and can titrate over the coming days)  - Start atorva 80 (he is ok retrialing). Repeat lipid panel pending  - Echo in the AM  - LHC on Monday   #Hypothyroidism: - Cont synthroid - Repeat TFTs  Risk Assessment/Risk Scores:  TIMI Risk Score for Unstable Angina or Non-ST Elevation MI:   The patient's TIMI risk score is 3, which indicates a 13% risk of all cause mortality, new or recurrent myocardial infarction or need for urgent revascularization in the next 14 days.{  Severity of Illness: The appropriate patient status for this patient is INPATIENT. Inpatient status is judged to be reasonable and necessary in order to provide the required intensity of service to ensure the patient's safety. The patient's presenting symptoms, physical exam findings, and initial radiographic and laboratory data in the context of their chronic comorbidities is felt to place them at high risk for further clinical deterioration. Furthermore, it is not anticipated that the patient will be medically stable for discharge from the hospital within 2 midnights of admission.   * I certify that at the point of admission it is my clinical judgment that the patient will require inpatient hospital care spanning beyond 2 midnights from the point of admission due to high intensity of service, high risk for further deterioration and high frequency of surveillance required.*   For questions or updates, please contact  [facility] Please consult [email or web address] for contact info under  Signed, Livingston Diones, MD  11/25/2020 3:42 AM

## 2020-11-25 NOTE — Plan of Care (Signed)
  Problem: Education: Goal: Knowledge of General Education information will improve Description: Including pain rating scale, medication(s)/side effects and non-pharmacologic comfort measures Outcome: Progressing   Problem: Health Behavior/Discharge Planning: Goal: Ability to manage health-related needs will improve Outcome: Progressing   Problem: Clinical Measurements: Goal: Cardiovascular complication will be avoided Outcome: Progressing   Problem: Pain Managment: Goal: General experience of comfort will improve Outcome: Progressing   Problem: Safety: Goal: Ability to remain free from injury will improve Outcome: Progressing   

## 2020-11-25 NOTE — Progress Notes (Addendum)
ANTICOAGULATION CONSULT NOTE - Follow Up Consult  Pharmacy Consult for IV heparin Indication: chest pain/ACS  Allergies  Allergen Reactions   Crestor [Rosuvastatin] Other (See Comments)    Aches    Hydrochlorothiazide Other (See Comments)   Lipitor [Atorvastatin] Other (See Comments)    Aches    Other Other (See Comments)   Zocor [Simvastatin] Other (See Comments)    aches    Patient Measurements: Height: 5\' 7"  (170.2 cm) Weight: 77.9 kg (171 lb 11.2 oz) IBW/kg (Calculated) : 66.1 Heparin Dosing Weight: 77.9  Vital Signs: Temp: 98.1 F (36.7 C) (11/13 0515) Temp Source: Oral (11/13 0515) BP: 123/74 (11/13 0515) Pulse Rate: 67 (11/13 0515)  Labs: Recent Labs    11/24/20 2352 11/25/20 0152 11/25/20 0411 11/25/20 0929  HGB 16.2  --  16.0  --   HCT 46.0  --  45.6  --   PLT 239  --  213  --   APTT 32  --   --   --   LABPROT 14.5  --   --   --   INR 1.1  --   --   --   HEPARINUNFRC  --   --   --  0.34  CREATININE 1.00  --  0.88  --   TROPONINIHS 1,050* 1,273*  --   --     Estimated Creatinine Clearance: 81.4 mL/min (by C-G formula based on SCr of 0.88 mg/dL).  Assessment: 48 YOM admitted on 11/24/20 for complaints of chest pain, found to have NSTEMI. Patient is currently undergoing medical management. Plan is for [facility] on Monday 11/14. Pharmacy consulted to dose heparin.   Heparin level 0.34 and therapeutic. CBC is stable. No signs of bleeding or IV site issues per RN.   Goal of Therapy:  Heparin level 0.3-0.7 units/ml Monitor platelets by anticoagulation protocol: Yes   Plan:  Continue IV heparin gtt at 1000 units/h Confirm heparin level in 6h Daily heparin level, cbc Monitor for signs/symptoms of bleeding F/u cards recs after LHC  Thank you for involving pharmacy in this patient's care.  12/14, PharmD PGY1 Ambulatory Care Pharmacy Resident 11/25/2020 11:25 AM  **Pharmacist phone directory can be found on [email or web address] listed under [facility]  Pharmacy**  Addendum: Heparin level 0.28 and subtherapeutic on 6h check. No signs of bleeding or IV site issues per RN. Will plan to increase heparin gtt and monitor heparin level in 6 h,   Plan: Increase IV heparin gtt to 1150 units/h Confirm heparin level in 6h Daily heparin level, CBC Monitor for signs/symptoms of bleeding  Thank you for involving pharmacy in this patient's care.  [facility], PharmD PGY1 Ambulatory Care Pharmacy Resident 11/25/2020 3:20 PM  **Pharmacist phone directory can be found on [email or web address] listed under [facility] Pharmacy**

## 2020-11-25 NOTE — Assessment & Plan Note (Signed)
Smokes about a pack and a half a day.  He states that he has decreased his nicotine and his cigarettes.  Counseled on tobacco cessation.

## 2020-11-25 NOTE — Progress Notes (Signed)
Progress Note  Patient Name: John Randolph Date of Encounter: 11/25/2020  [facility] Cardiologist: None John Randolph new  Subjective   Currently feeling better no chest pain.  He does have some wheezes in his lungs.  Still smokes.  Burning was quite severe yesterday in his chest.  Inpatient Medications    Scheduled Meds:  [START ON 11/26/2020] aspirin EC  81 mg Oral Daily   atorvastatin  80 mg Oral Daily   captopril  6.25 mg Oral TID   levothyroxine  100 mcg Oral QAC breakfast   metoprolol tartrate  12.5 mg Oral BID   ticagrelor  90 mg Oral BID   Continuous Infusions:  sodium chloride 15 mL/hr at 11/25/20 0600   heparin 1,000 Units/hr (11/25/20 0600)   nitroGLYCERIN 5 mcg/min (11/25/20 0600)   PRN Meds: acetaminophen, nicotine polacrilex, nitroGLYCERIN, ondansetron (ZOFRAN) IV   Vital Signs    Vitals:   11/25/20 0445 11/25/20 0503 11/25/20 0504 11/25/20 0515  BP: 101/69   123/74  Pulse: 61   67  Resp: 19   19  Temp:  98 F (36.7 C)  98.1 F (36.7 C)  TempSrc:  Oral  Oral  SpO2: 95%   98%  Weight:   77.9 kg   Height:   5\' 7"  (1.702 m)     Intake/Output Summary (Last 24 hours) at 11/25/2020 0931 Last data filed at 11/25/2020 0600 Gross per 24 hour  Intake 239.95 ml  Output --  Net 239.95 ml   Last 3 Weights 11/25/2020 01/04/2020 03/16/2017  Weight (lbs) 171 lb 11.2 oz 171 lb 3.2 oz 171 lb  Weight (kg) 77.883 kg 77.656 kg 77.565 kg      Telemetry    Sinus rhythm no adverse arrhythmias- Personally Reviewed  ECG    Sinus rhythm subtle aVR elevation somewhat diffuse ST segment depression mild- Personally Reviewed  Physical Exam   GEN: No acute distress.   Neck: No JVD Cardiac: RRR, no murmurs, rubs, or gallops.  Respiratory: Wheezes bilaterally. GI: Soft, nontender, non-distended  MS: No edema; No deformity. Neuro:  Nonfocal  Psych: Normal affect   Labs    High Sensitivity Troponin:   Recent Labs  Lab 11/24/20 2352 11/25/20 0152   TROPONINIHS 1,050* 1,273*     Chemistry Recent Labs  Lab 11/24/20 2352 11/25/20 0411  NA 135 136  K 3.2* 3.5  CL 103 103  CO2 25 24  GLUCOSE 112* 108*  BUN 13 11  CREATININE 1.00 0.88  CALCIUM 8.8* 9.1  PROT 7.0  --   ALBUMIN 3.9  --   AST 33  --   ALT 16  --   ALKPHOS 80  --   BILITOT 0.6  --   GFRNONAA >60 >60  ANIONGAP 7 9    Lipids  Recent Labs  Lab 11/24/20 2352  CHOL 234*  TRIG 247*  HDL 38*  LDLCALC 147*  CHOLHDL 6.2    Hematology Recent Labs  Lab 11/24/20 2352 11/25/20 0411  WBC 10.5 8.2  RBC 5.19 5.14  HGB 16.2 16.0  HCT 46.0 45.6  MCV 88.6 88.7  MCH 31.2 31.1  MCHC 35.2 35.1  RDW 13.6 13.7  PLT 239 213   Thyroid  Recent Labs  Lab 11/25/20 0411  TSH 1.676  FREET4 1.02    BNP Recent Labs  Lab 11/25/20 0411  BNP 76.3    DDimer No results for input(s): DDIMER in the last 168 hours.   Radiology    DG  Chest 2 View  Result Date: 11/25/2020 CLINICAL DATA:  Chest pain.  Shortness of breath. EXAM: CHEST - 2 VIEW COMPARISON:  Chest CT 01/04/2020 FINDINGS: The heart is normal in size. Aortic atherosclerosis and tortuosity. Streaky bandlike opacity in the right middle lobe. Mild bronchial thickening. Calcified granuloma in the right mid lung zone. No pulmonary edema, pleural effusion, or pneumothorax. No acute osseous abnormalities are seen. IMPRESSION: 1. Streaky bandlike right middle lobe opacity, favor atelectasis. 2. Mild bronchial thickening, chronic. Electronically Signed   By: Narda Rutherford M.D.   On: 11/25/2020 00:00    Cardiac Studies   Awaiting echocardiogram, cardiac catheterization  Patient Profile     65 y.o. male here with non-ST elevation myocardial infarction, smoker OSA CPAP hyperlipidemia  Assessment & Plan    NSTEMI (non-ST elevated myocardial infarction) (HCC) Most recent troponin 1200.  Arrived with positive troponin greater than 2000.  Multiple risk factors.  Coronary calcifications noted on lung cancer CT.   Smoker.  Brilinta was started with aspirin overnight by Dr. Joyce Gross in anticipation of PCI.  Agree with cardiac catheterization tomorrow.  Currently stable.  Metoprolol 12.5 mg has been started twice a day.  High intensity statin atorvastatin 80 mg.  He was also started on captopril for rapid titration.  We will switch him over to lisinopril 5 mg once a day.  Appreciate pharmacy assistance.  Previously on pravastatin.  Await echocardiogram.  Risks and benefits of cardiac catheterization including stroke heart attack death renal impairment bleeding have been discussed.  He is willing to proceed.  Tomorrow Monday.    Mixed hyperlipidemia Atorvastatin 80 mg has been started.  Previously on pravastatin.  Willing to try high intensity statin once again.  LDL was 147.  LDL goal is less than 70.  Coronary artery disease involving native coronary artery of native heart with angina pectoris (HCC) Coronary artery disease/calcified plaque was noted on prior lung cancer screening.  Awaiting heart catheterization.  Subtle aVR elevation, depression somewhat diffusely, could have triple-vessel disease.  Obstructive sleep apnea Continue with CPAP at home.      For questions or updates, please contact [facility] Please consult [email or web address] for contact info under        Signed, Donato Schultz, MD  11/25/2020, 9:31 AM

## 2020-11-25 NOTE — Assessment & Plan Note (Signed)
Continue with CPAP at home.

## 2020-11-25 NOTE — ED Notes (Signed)
notified MD of Critical troponin 1050

## 2020-11-25 NOTE — Assessment & Plan Note (Addendum)
Most recent troponin 1200.  Arrived with positive troponin greater than 2000.  Multiple risk factors.  Coronary calcifications noted on lung cancer CT.  Smoker.  Brilinta was started with aspirin overnight by Dr. Joyce Gross in anticipation of PCI.  Agree with cardiac catheterization tomorrow.  Currently stable.  Metoprolol 12.5 mg has been started twice a day.  High intensity statin atorvastatin 80 mg.  He was also started on captopril for rapid titration.  We will switch him over to lisinopril 5 mg once a day.  Appreciate pharmacy assistance.  Previously on pravastatin.  Await echocardiogram.  We will go ahead and stop his 5 mcg of IV nitroglycerin.  Risks and benefits of cardiac catheterization including stroke heart attack death renal impairment bleeding have been discussed.  He is willing to proceed.  Tomorrow Monday.

## 2020-11-25 NOTE — ED Notes (Signed)
Pt brought to green zone for repeat troponin draw

## 2020-11-25 NOTE — Progress Notes (Addendum)
ANTICOAGULATION CONSULT NOTE - Follow Up Consult  Pharmacy Consult for IV heparin Indication: chest pain/ACS  Allergies  Allergen Reactions   Crestor [Rosuvastatin] Other (See Comments)    Aches    Hydrochlorothiazide Other (See Comments)   Lipitor [Atorvastatin] Other (See Comments)    Aches    Other Other (See Comments)   Zocor [Simvastatin] Other (See Comments)    aches    Patient Measurements: Height: 5\' 7"  (170.2 cm) Weight: 77.9 kg (171 lb 11.2 oz) IBW/kg (Calculated) : 66.1 Heparin Dosing Weight: 77.9  Vital Signs: Temp: 97.7 F (36.5 C) (11/13 2000) Temp Source: Oral (11/13 2000) BP: 132/78 (11/13 2000) Pulse Rate: 70 (11/13 2000)  Labs: Recent Labs    11/24/20 2352 11/25/20 0152 11/25/20 0411 11/25/20 0929 11/25/20 1427 11/25/20 2116  HGB 16.2  --  16.0  --   --   --   HCT 46.0  --  45.6  --   --   --   PLT 239  --  213  --   --   --   APTT 32  --   --   --   --   --   LABPROT 14.5  --   --   --   --   --   INR 1.1  --   --   --   --   --   HEPARINUNFRC  --   --   --  0.34 0.28* 0.26*  CREATININE 1.00  --  0.88  --   --   --   TROPONINIHS 1,050* 1,273*  --   --   --   --      Estimated Creatinine Clearance: 81.4 mL/min (by C-G formula based on SCr of 0.88 mg/dL).  Assessment: 34 YOM admitted on 11/24/20 for complaints of chest pain, found to have NSTEMI. Patient is currently undergoing medical management. Plan is for [facility] on Monday 11/14. Pharmacy consulted to dose heparin.   Heparin level 0.26 on recheck tonight with heparin infusing at 1150 units/hr. No bleeding or IV issues noted.   Goal of Therapy:  Heparin level 0.3-0.7 units/ml Monitor platelets by anticoagulation protocol: Yes   Plan:  Increase IV heparin gtt to 1300 units/hr Daily heparin level, cbc Monitor for signs/symptoms of bleeding F/u cards recs after LHC  Thank you for involving pharmacy in this patient's care.  12/14 PharmD., BCPS Clinical Pharmacist 11/25/2020  10:22 PM   **Pharmacist phone directory can be found on [email or web address] listed under [facility]**

## 2020-11-25 NOTE — Assessment & Plan Note (Signed)
Atorvastatin 80 mg has been started.  Previously on pravastatin.  Willing to try high intensity statin once again.  LDL was 147.  LDL goal is less than 70.

## 2020-11-25 NOTE — ED Provider Notes (Signed)
[facility] EMERGENCY DEPARTMENT Provider Note   CSN: [id number] Arrival date & time: 11/24/20  2319     History Chief Complaint  Patient presents with   Chest Pain    ELAINE MIDDLETON is a 65 y.o. male.  HPI     This is a 65 year old male with a history of hypertension, hyperlipidemia, hypothyroidism, smoking who presents with chest pain.  Patient had onset of chest pain around 10 PM.  He states that it was over the anterior chest and initially felt like heartburn.  He took Tums with no relief.  Pain intensified and then he began to have pain in his left arm.  It does not otherwise radiate.  Currently his pain is 4 out of 10.  He does not necessarily associated with exertion but states he is not truly exerted himself since onset of pain.  Does not report prior history of similar pain.  No known history of coronary artery disease.  He is an every day smoker x40 years.  Denies lower extremity swelling.  Does have some associated shortness of breath.  No recent cough or fevers.  Past Medical History:  Diagnosis Date   Hyperlipidemia    Hypertension    Hypothyroidism    Sleep apnea    DX years ago but never used CPAP   Thyroid disease    hypothyroidism    There are no problems to display for this patient.   Past Surgical History:  Procedure Laterality Date   LAPAROSCOPIC INGUINAL HERNIA WITH UMBILICAL HERNIA N/A 03/12/2017   Procedure: LAPAROSCOPIC BILATERAL  INGUINAL HERNIA REPAIR WITH MESH AND OPEN UMBILICAL HERNIA REPAIR;  Surgeon: Sheliah Hatch, De Blanch, MD;  Location: [facility] ORS;  Service: General;  Laterality: N/A;   THYROIDECTOMY         No family history on file.  Social History   Tobacco Use   Smoking status: Every Day    Packs/day: 1.50    Years: 30.00    Pack years: 45.00    Types: Cigarettes   Smokeless tobacco: Never  Vaping Use   Vaping Use: Never used  Substance Use Topics   Alcohol use: Yes    Comment: occassionally   Drug use:  No    Home Medications Prior to Admission medications   Medication Sig Start Date End Date Taking? Authorizing Provider  levothyroxine (SYNTHROID, LEVOTHROID) 100 MCG tablet Take 100 mcg by mouth daily before breakfast.   Yes [provider]  LORazepam (ATIVAN) 0.5 MG tablet Take 0.5 mg by mouth at bedtime as needed for sleep.   Yes [provider]  pravastatin (PRAVACHOL) 80 MG tablet Take 80 mg by mouth daily at 6 PM.   Yes [provider]  tadalafil (CIALIS) 5 MG tablet Take 5 mg by mouth daily as needed for erectile dysfunction. 09/12/20  Yes [provider]    Allergies    Crestor [rosuvastatin], Hydrochlorothiazide, Lipitor [atorvastatin], Other, and Zocor [simvastatin]  Review of Systems   Review of Systems  Constitutional:  Negative for fever.  Respiratory:  Positive for shortness of breath.   Cardiovascular:  Positive for chest pain. Negative for leg swelling.  Gastrointestinal:  Negative for abdominal pain, nausea and vomiting.  Genitourinary:  Negative for dysuria.  Neurological:  Negative for headaches.  All other systems reviewed and are negative.  Physical Exam Updated Vital Signs BP (!) 146/81   Pulse 78   Temp 97.9 F (36.6 C) (Oral)   Resp 20   SpO2  100%   Physical Exam Vitals and nursing note reviewed.  Constitutional:      Appearance: He is well-developed. He is not ill-appearing.  HENT:     Head: Normocephalic and atraumatic.  Eyes:     Pupils: Pupils are equal, round, and reactive to light.  Cardiovascular:     Rate and Rhythm: Normal rate and regular rhythm.     Heart sounds: Normal heart sounds. No murmur heard. Pulmonary:     Effort: Pulmonary effort is normal. No respiratory distress.     Breath sounds: Wheezing present.     Comments: Scant expiratory wheeze noted Abdominal:     General: Bowel sounds are normal.     Palpations: Abdomen is soft.     Tenderness: There is no abdominal tenderness. There is no  rebound.  Musculoskeletal:     Cervical back: Neck supple.     Right lower leg: No tenderness. No edema.     Left lower leg: No tenderness. No edema.  Lymphadenopathy:     Cervical: No cervical adenopathy.  Skin:    General: Skin is warm and dry.  Neurological:     Mental Status: He is alert and oriented to person, place, and time.  Psychiatric:        Mood and Affect: Mood normal.    ED Results / Procedures / Treatments   Labs (all labs ordered are listed, but only abnormal results are displayed) Labs Reviewed  COMPREHENSIVE METABOLIC PANEL - Abnormal; Notable for the following components:      Result Value   Potassium 3.2 (*)    Glucose, Bld 112 (*)    Calcium 8.8 (*)    All other components within normal limits  TROPONIN I (HIGH SENSITIVITY) - Abnormal; Notable for the following components:   Troponin I (High Sensitivity) 1,050 (*)    All other components within normal limits  RESP PANEL BY RT-PCR (FLU A&B, COVID) ARPGX2  CBC WITH DIFFERENTIAL/PLATELET  HEMOGLOBIN A1C  PROTIME-INR  APTT  LIPID PANEL  TROPONIN I (HIGH SENSITIVITY)    EKG EKG Interpretation  Date/Time:  Saturday November 24 2020 23:18:01 EST Ventricular Rate:  96 PR Interval:  142 QRS Duration: 94 QT Interval:  358 QTC Calculation: 452 R Axis:   -13 Text Interpretation: Normal sinus rhythm Incomplete right bundle branch block ST depression, consider subendocardial injury Nonspecific T wave abnormality Abnormal ECG ST depression anteriorly/laterally new when compared to prior Confirmed by Ross Marcus ([id number]) on 11/25/2020 1:55:08 AM  Radiology DG Chest 2 View  Result Date: 11/25/2020 CLINICAL DATA:  Chest pain.  Shortness of breath. EXAM: CHEST - 2 VIEW COMPARISON:  Chest CT 01/04/2020 FINDINGS: The heart is normal in size. Aortic atherosclerosis and tortuosity. Streaky bandlike opacity in the right middle lobe. Mild bronchial thickening. Calcified granuloma in the right mid lung zone. No  pulmonary edema, pleural effusion, or pneumothorax. No acute osseous abnormalities are seen. IMPRESSION: 1. Streaky bandlike right middle lobe opacity, favor atelectasis. 2. Mild bronchial thickening, chronic. Electronically Signed   By: Narda Rutherford M.D.   On: 11/25/2020 00:00    Procedures .Critical Care Performed by: Shon Baton, MD Authorized by: Shon Baton, MD   Critical care provider statement:    Critical care time (minutes):  40   Critical care was necessary to treat or prevent imminent or life-threatening deterioration of the following conditions:  Cardiac failure   Critical care was time spent personally by me on the following activities:  Development of  treatment plan with patient or surrogate, discussions with consultants, evaluation of patient's response to treatment, examination of patient, ordering and review of laboratory studies, ordering and review of radiographic studies, ordering and performing treatments and interventions, pulse oximetry, re-evaluation of patient's condition and review of old charts   Medications Ordered in ED Medications  nitroGLYCERIN (NITROSTAT) SL tablet 0.4 mg (has no administration in time range)  0.9 %  sodium chloride infusion (has no administration in time range)  heparin injection 60 Units/kg (has no administration in time range)  nitroGLYCERIN 50 mg in dextrose 5 % 250 mL (0.2 mg/mL) infusion (has no administration in time range)  aspirin chewable tablet 324 mg (324 mg Oral Given 11/25/20 0215)    ED Course  I have reviewed the triage vital signs and the nursing notes.  Pertinent labs & imaging results that were available during my care of the patient were reviewed by me and considered in my medical decision making (see chart for details).    MDM Rules/Calculators/A&P                           Patient presents with chest pain.  Onset and chest pain around 10 PM.  It has been constant but waxing and waning in intensity.   He is nontoxic-appearing.  Vital signs notable for blood pressure of 146/81.  Initial EKG shows some slight depressions anterior laterally.  No ST elevation.  Some of his symptoms are concerning for ACS including description of pain and left arm symptoms.  He certainly has risk factors including smoking, hypertension, hyperlipidemia.  No risk factors for PE.  Chest x-ray does not show any pneumothorax or pneumonia.  He does have a scant wheeze but he is not in any respiratory distress and doubt infectious process.  Initial troponin 1050.  Patient was started on heparin and nitroglycerin.  Cardiology consulted and will evaluate the patient for admission.  Anticipate he will need cardiac catheterization later today.  Final Clinical Impression(s) / ED Diagnoses Final diagnoses:  NSTEMI (non-ST elevated myocardial infarction) [facility])    Rx / DC Orders ED Discharge Orders     None        Alisan Dokes, Mayer Masker, MD 11/25/20 [phone]

## 2020-11-25 NOTE — Progress Notes (Signed)
ANTICOAGULATION CONSULT NOTE - Initial Consult  Pharmacy Consult for heparin Indication: chest pain/ACS  Allergies  Allergen Reactions   Crestor [Rosuvastatin] Other (See Comments)    Aches    Hydrochlorothiazide Other (See Comments)   Lipitor [Atorvastatin] Other (See Comments)    Aches    Other Other (See Comments)   Zocor [Simvastatin] Other (See Comments)    aches    Patient Measurements:   Heparin Dosing Weight: 80kg  Vital Signs: Temp: 97.9 F (36.6 C) (11/12 2323) Temp Source: Oral (11/12 2323) BP: 146/81 (11/13 0218) Pulse Rate: 78 (11/13 0218)  Labs: Recent Labs    11/24/20 2352  HGB 16.2  HCT 46.0  PLT 239  CREATININE 1.00  TROPONINIHS 1,050*    CrCl cannot be calculated (Unknown ideal weight.).   Medical History: Past Medical History:  Diagnosis Date   Hyperlipidemia    Hypertension    Hypothyroidism    Sleep apnea    DX years ago but never used CPAP   Thyroid disease    hypothyroidism    Assessment: 49 YOM presenting with CP, elevated troponin, he is not on anticoagulation PTA, CBC wnl  Goal of Therapy:  Heparin level 0.3-0.7 units/ml Monitor platelets by anticoagulation protocol: Yes   Plan:  Heparin 4000 units IV x 1, and gtt at 1000 units/hr F/u 6 hour heparin level F/u cards eval and recs  Daylene Posey, PharmD Clinical Pharmacist ED Pharmacist Phone # [phone] 11/25/2020 2:31 AM

## 2020-11-26 ENCOUNTER — Other Ambulatory Visit (HOSPITAL_COMMUNITY): Payer: Self-pay

## 2020-11-26 ENCOUNTER — Inpatient Hospital Stay (HOSPITAL_COMMUNITY)
Admission: EM | Disposition: A | Discharge status: Home / Self Care | Payer: Self-pay | Source: Home / Self Care | Attending: Internal Medicine

## 2020-11-26 ENCOUNTER — Inpatient Hospital Stay (HOSPITAL_COMMUNITY): Payer: BC Managed Care – PPO

## 2020-11-26 DIAGNOSIS — I2510 Atherosclerotic heart disease of native coronary artery without angina pectoris: Secondary | ICD-10-CM

## 2020-11-26 DIAGNOSIS — I214 Non-ST elevation (NSTEMI) myocardial infarction: Secondary | ICD-10-CM

## 2020-11-26 HISTORY — PX: LEFT HEART CATH AND CORONARY ANGIOGRAPHY: CATH118249

## 2020-11-26 LAB — CBC
HCT: 45.9 % (ref 39.0–52.0)
Hemoglobin: 15.9 g/dL (ref 13.0–17.0)
MCH: 30.6 pg (ref 26.0–34.0)
MCHC: 34.6 g/dL (ref 30.0–36.0)
MCV: 88.3 fL (ref 80.0–100.0)
Platelets: 214 10*3/uL (ref 150–400)
RBC: 5.2 MIL/uL (ref 4.22–5.81)
RDW: 13.6 % (ref 11.5–15.5)
WBC: 8.3 10*3/uL (ref 4.0–10.5)
nRBC: 0 % (ref 0.0–0.2)

## 2020-11-26 LAB — ECHOCARDIOGRAM COMPLETE
AR max vel: 2.56 cm2
AV Peak grad: 5 mmHg
Ao pk vel: 1.12 m/s
Area-P 1/2: 2.52 cm2
Calc EF: 53 %
Height: 67 in
S' Lateral: 4.1 cm
Single Plane A2C EF: 52.9 %
Single Plane A4C EF: 52.1 %
Weight: 2747.2 oz

## 2020-11-26 LAB — HEPARIN LEVEL (UNFRACTIONATED): Heparin Unfractionated: 0.38 IU/mL (ref 0.30–0.70)

## 2020-11-26 LAB — CREATININE, SERUM
Creatinine, Ser: 0.97 mg/dL (ref 0.61–1.24)
GFR, Estimated: >60 mL/min (ref >60)

## 2020-11-26 LAB — POCT ACTIVATED CLOTTING TIME
Activated Clotting Time: 694 seconds
Activated Clotting Time: 289 seconds

## 2020-11-26 SURGERY — LEFT HEART CATH AND CORONARY ANGIOGRAPHY
Anesthesia: LOCAL

## 2020-11-26 MED ORDER — IOHEXOL 350 MG/ML SOLN
INTRAVENOUS | Status: DC | PRN
  Administered 2020-11-26: 130 mL

## 2020-11-26 MED ORDER — HEPARIN (PORCINE) IN NACL 1000-0.9 UT/500ML-% IV SOLN
INTRAVENOUS | Status: AC
  Filled 2020-11-26: qty 500

## 2020-11-26 MED ORDER — SODIUM CHLORIDE 0.9% FLUSH
3.0000 mL | Freq: Two times a day (BID) | INTRAVENOUS | Status: DC
  Administered 2020-11-27 (×2): 3 mL via INTRAVENOUS

## 2020-11-26 MED ORDER — FENTANYL CITRATE (PF) 100 MCG/2ML IJ SOLN
INTRAMUSCULAR | Status: AC
  Filled 2020-11-26: qty 2

## 2020-11-26 MED ORDER — HEPARIN SODIUM (PORCINE) 5000 UNIT/ML IJ SOLN: 5000.0000 [IU] | Freq: Three times a day (TID) | INTRAMUSCULAR | Status: DC

## 2020-11-26 MED ORDER — SODIUM CHLORIDE 0.9% FLUSH: 3.0000 mL | INTRAVENOUS | Status: DC | PRN

## 2020-11-26 MED ORDER — SODIUM CHLORIDE 0.9 % IV SOLN
INTRAVENOUS | Status: AC | PRN
  Administered 2020-11-26: 500 mL via INTRAVENOUS

## 2020-11-26 MED ORDER — MIDAZOLAM HCL 2 MG/2ML IJ SOLN
INTRAMUSCULAR | Status: AC
  Filled 2020-11-26: qty 2

## 2020-11-26 MED ORDER — HEPARIN (PORCINE) IN NACL 1000-0.9 UT/500ML-% IV SOLN
INTRAVENOUS | Status: DC | PRN
  Administered 2020-11-26 (×2): 500 mL

## 2020-11-26 MED ORDER — HEPARIN SODIUM (PORCINE) 1000 UNIT/ML IJ SOLN
INTRAMUSCULAR | Status: AC
  Filled 2020-11-26: qty 1

## 2020-11-26 MED ORDER — SODIUM CHLORIDE 0.9 % WEIGHT BASED INFUSION
1.0000 mL/kg/h | INTRAVENOUS | Status: AC
  Administered 2020-11-26: 1 mL/kg/h via INTRAVENOUS

## 2020-11-26 MED ORDER — HEPARIN SODIUM (PORCINE) 5000 UNIT/ML IJ SOLN
5000.0000 [IU] | Freq: Three times a day (TID) | INTRAMUSCULAR | Status: DC
  Administered 2020-11-27: 5000 [IU] via SUBCUTANEOUS
  Filled 2020-11-26: qty 1

## 2020-11-26 MED ORDER — MIDAZOLAM HCL 2 MG/2ML IJ SOLN
INTRAMUSCULAR | Status: DC | PRN
  Administered 2020-11-26: 2 mg via INTRAVENOUS

## 2020-11-26 MED ORDER — VERAPAMIL HCL 2.5 MG/ML IV SOLN
INTRAVENOUS | Status: AC
  Filled 2020-11-26: qty 2

## 2020-11-26 MED ORDER — SODIUM CHLORIDE 0.9 % WEIGHT BASED INFUSION
3.0000 mL/kg/h | INTRAVENOUS | Status: DC
  Administered 2020-11-26: 3 mL/kg/h via INTRAVENOUS

## 2020-11-26 MED ORDER — HEPARIN SODIUM (PORCINE) 1000 UNIT/ML IJ SOLN
INTRAMUSCULAR | Status: DC | PRN
  Administered 2020-11-26 (×2): 4000 [IU] via INTRAVENOUS

## 2020-11-26 MED ORDER — VERAPAMIL HCL 2.5 MG/ML IV SOLN
INTRAVENOUS | Status: DC | PRN
  Administered 2020-11-26: 10 mL via INTRA_ARTERIAL

## 2020-11-26 MED ORDER — NITROGLYCERIN 1 MG/10 ML FOR IR/CATH LAB
INTRA_ARTERIAL | Status: DC | PRN
  Administered 2020-11-26 (×2): 100 ug via INTRACORONARY
  Administered 2020-11-26: 200 ug via INTRACORONARY

## 2020-11-26 MED ORDER — LIDOCAINE HCL (PF) 1 % IJ SOLN
INTRAMUSCULAR | Status: AC
  Filled 2020-11-26: qty 30

## 2020-11-26 MED ORDER — SODIUM CHLORIDE 0.9 % WEIGHT BASED INFUSION: 1.0000 mL/kg/h | INTRAVENOUS | Status: DC

## 2020-11-26 MED ORDER — HYDRALAZINE HCL 20 MG/ML IJ SOLN: 10.0000 mg | INTRAMUSCULAR | Status: AC | PRN

## 2020-11-26 MED ORDER — FENTANYL CITRATE (PF) 100 MCG/2ML IJ SOLN
INTRAMUSCULAR | Status: DC | PRN
  Administered 2020-11-26: 25 ug via INTRAVENOUS

## 2020-11-26 MED ORDER — NITROGLYCERIN 1 MG/10 ML FOR IR/CATH LAB
INTRA_ARTERIAL | Status: AC
  Filled 2020-11-26: qty 10

## 2020-11-26 MED ORDER — SODIUM CHLORIDE 0.9 % IV SOLN: 250.0000 mL | INTRAVENOUS | Status: DC | PRN

## 2020-11-26 SURGICAL SUPPLY — 19 items
BALLN SAPPHIRE 2.5X12 (BALLOONS) ×2
BALLN SAPPHIRE ~~LOC~~ 3.0X18 (BALLOONS) ×1 IMPLANT
BALLN SAPPHIRE ~~LOC~~ 3.5X12 (BALLOONS) ×1 IMPLANT
BALLOON SAPPHIRE 2.5X12 (BALLOONS) IMPLANT
CATH 5FR JL3.5 JR4 ANG PIG MP (CATHETERS) ×1 IMPLANT
CATH LAUNCHER 6FR EBU3.5 (CATHETERS) ×1 IMPLANT
DEVICE RAD COMP TR BAND LRG (VASCULAR PRODUCTS) ×1 IMPLANT
GLIDESHEATH SLEND SS 6F .021 (SHEATH) ×1 IMPLANT
GUIDEWIRE INQWIRE 1.5J.035X260 (WIRE) IMPLANT
INQWIRE 1.5J .035X260CM (WIRE) ×2
KIT ENCORE 26 ADVANTAGE (KITS) ×1 IMPLANT
KIT HEART LEFT (KITS) ×2 IMPLANT
PACK CARDIAC CATHETERIZATION (CUSTOM PROCEDURE TRAY) ×2 IMPLANT
STENT SYNERGY XD 3.0X38 (Permanent Stent) IMPLANT
SYNERGY XD 3.0X38 (Permanent Stent) ×2 IMPLANT
TRANSDUCER W/STOPCOCK (MISCELLANEOUS) ×2 IMPLANT
TUBING CIL FLEX 10 FLL-RA (TUBING) ×2 IMPLANT
WIRE ASAHI PROWATER 180CM (WIRE) ×1 IMPLANT
WIRE COUGAR XT STRL 190CM (WIRE) ×1 IMPLANT

## 2020-11-26 NOTE — Progress Notes (Addendum)
ANTICOAGULATION CONSULT NOTE - Follow Up Consult  Pharmacy Consult for Heparin Indication: chest pain/ACS  Allergies  Allergen Reactions   Crestor [Rosuvastatin] Other (See Comments)    Aches    Hydrochlorothiazide Other (See Comments)   Lipitor [Atorvastatin] Other (See Comments)    Aches    Other Other (See Comments)   Zocor [Simvastatin] Other (See Comments)    aches    Patient Measurements: Height: 5\' 7"  (170.2 cm) Weight: 77.9 kg (171 lb 11.2 oz) IBW/kg (Calculated) : 66.1 Heparin Dosing Weight: 77.9 kg  Vital Signs: Temp: 98.2 F (36.8 C) (11/14 0512) Temp Source: Oral (11/14 0512) BP: 113/76 (11/14 1043) Pulse Rate: 58 (11/14 0512)  Labs: Recent Labs    11/24/20 2352 11/25/20 0152 11/25/20 0411 11/25/20 0929 11/25/20 1427 11/25/20 2116 11/26/20 0150 11/26/20 0427  HGB 16.2  --  16.0  --   --   --   --  15.9  HCT 46.0  --  45.6  --   --   --   --  45.9  PLT 239  --  213  --   --   --   --  214  APTT 32  --   --   --   --   --   --   --   LABPROT 14.5  --   --   --   --   --   --   --   INR 1.1  --   --   --   --   --   --   --   HEPARINUNFRC  --   --   --    < > 0.28* 0.26* 0.38  --   CREATININE 1.00  --  0.88  --   --   --   --   --   TROPONINIHS 1,050* 1,273*  --   --   --   --   --   --    < > = values in this interval not displayed.    Estimated Creatinine Clearance: 81.4 mL/min (by C-G formula based on SCr of 0.88 mg/dL).   Medications:  Scheduled:   [MAR Hold] aspirin EC  81 mg Oral Daily   [MAR Hold] atorvastatin  80 mg Oral Daily   [MAR Hold] levothyroxine  100 mcg Oral QAC breakfast   [MAR Hold] lisinopril  5 mg Oral Daily   [MAR Hold] metoprolol tartrate  12.5 mg Oral BID   [MAR Hold] ticagrelor  90 mg Oral BID    Assessment: 65 yo M presenting with NSTEMI, not on anticoagulation PTA.   Heparin level this morning therapeutic at 0.38, on 1300 units/hr. CBC stable. No line issues or signs/symptoms of bleeding noted. Plans noted for  cardiac cath on 11/14.   Goal of Therapy:  Heparin level 0.3-0.7 units/ml Monitor platelets by anticoagulation protocol: Yes   Plan:  Continue heparin at 1300 units/hr.  Check daily CBC and heparin level.  Monitor for signs/symptoms of bleeding.   Addendum: S/p cath showing single vessel obstructive CAD, PCI of LCx/OM1 with DES x1. Heparin discontinued post-cath.  Plans noted for at least 12 months of DAPT with aspirin 81mg  daily and ticagrelor 90mg  twice daily.   12/14, PharmD PGY1 Pharmacy Resident Phone [phone] 11/26/2020 1:30 PM   Please check AMION for all [facility] Pharmacy phone numbers After 10:00 PM, call Main Pharmacy [phone]

## 2020-11-26 NOTE — Progress Notes (Signed)
Progress Note  Patient Name: John Randolph Date of Encounter: 11/26/2020  [facility] Cardiologist: None   Subjective   No complaints.  Inpatient Medications    Scheduled Meds:  aspirin EC  81 mg Oral Daily   atorvastatin  80 mg Oral Daily   levothyroxine  100 mcg Oral QAC breakfast   lisinopril  5 mg Oral Daily   metoprolol tartrate  12.5 mg Oral BID   ticagrelor  90 mg Oral BID   Continuous Infusions:  sodium chloride 15 mL/hr at 11/25/20 0600   heparin 1,300 Units/hr (11/25/20 2233)   nitroGLYCERIN Stopped (11/25/20 0941)   PRN Meds: acetaminophen, nicotine polacrilex, nitroGLYCERIN, ondansetron (ZOFRAN) IV   Vital Signs    Vitals:   11/25/20 1700 11/25/20 2000 11/26/20 0036 11/26/20 0512  BP: 121/84 132/78 128/86 107/70  Pulse: 61 70 64 (!) 58  Resp: 18 18 16 18   Temp: 98.3 F (36.8 C) 97.7 F (36.5 C) 98 F (36.7 C) 98.2 F (36.8 C)  TempSrc: Oral Oral Oral Oral  SpO2:  96% 96% 96%  Weight:      Height:        Intake/Output Summary (Last 24 hours) at 11/26/2020 0750 Last data filed at 11/25/2020 1604 Gross per 24 hour  Intake 257.9 ml  Output --  Net 257.9 ml   Last 3 Weights 11/25/2020 01/04/2020 03/16/2017  Weight (lbs) 171 lb 11.2 oz 171 lb 3.2 oz 171 lb  Weight (kg) 77.883 kg 77.656 kg 77.565 kg      Telemetry    SR-->SB - Personally Reviewed  ECG    No new tracing this morning  Physical Exam   GEN: No acute distress.   Neck: No JVD Cardiac: RRR, no murmurs, rubs, or gallops.  Respiratory: Clear to auscultation bilaterally. GI: Soft, nontender, non-distended  MS: No edema; No deformity. Neuro:  Nonfocal  Psych: Normal affect   Labs    High Sensitivity Troponin:   Recent Labs  Lab 11/24/20 2352 11/25/20 0152  TROPONINIHS 1,050* 1,273*     Chemistry Recent Labs  Lab 11/24/20 2352 11/25/20 0411  NA 135 136  K 3.2* 3.5  CL 103 103  CO2 25 24  GLUCOSE 112* 108*  BUN 13 11  CREATININE 1.00 0.88  CALCIUM  8.8* 9.1  PROT 7.0  --   ALBUMIN 3.9  --   AST 33  --   ALT 16  --   ALKPHOS 80  --   BILITOT 0.6  --   GFRNONAA >60 >60  ANIONGAP 7 9    Lipids  Recent Labs  Lab 11/24/20 2352  CHOL 234*  TRIG 247*  HDL 38*  LDLCALC 147*  CHOLHDL 6.2    Hematology Recent Labs  Lab 11/24/20 2352 11/25/20 0411 11/26/20 0427  WBC 10.5 8.2 8.3  RBC 5.19 5.14 5.20  HGB 16.2 16.0 15.9  HCT 46.0 45.6 45.9  MCV 88.6 88.7 88.3  MCH 31.2 31.1 30.6  MCHC 35.2 35.1 34.6  RDW 13.6 13.7 13.6  PLT 239 213 214   Thyroid  Recent Labs  Lab 11/25/20 0411  TSH 1.676  FREET4 1.02    BNP Recent Labs  Lab 11/25/20 0411  BNP 76.3    DDimer No results for input(s): DDIMER in the last 168 hours.   Radiology    DG Chest 2 View  Result Date: 11/25/2020 CLINICAL DATA:  Chest pain.  Shortness of breath. EXAM: CHEST - 2 VIEW COMPARISON:  Chest CT  01/04/2020 FINDINGS: The heart is normal in size. Aortic atherosclerosis and tortuosity. Streaky bandlike opacity in the right middle lobe. Mild bronchial thickening. Calcified granuloma in the right mid lung zone. No pulmonary edema, pleural effusion, or pneumothorax. No acute osseous abnormalities are seen. IMPRESSION: 1. Streaky bandlike right middle lobe opacity, favor atelectasis. 2. Mild bronchial thickening, chronic. Electronically Signed   By: Narda Rutherford M.D.   On: 11/25/2020 00:00    Cardiac Studies   Echo: pending  Patient Profile     65 y.o. male with PMH of HLD, OSA on Cpap, hypothyroidism and tobacco use who presented with chest pain and found to have a NSTEMI.   Assessment & Plan    NSTEMI: hsTn peaked 1273. Hx of coronary calcifications on CT. He was admitted and loaded with Brilinta on 11/13. Planned for cardiac cath today. No chest pain overnight. -- on IV heparin,nitro, metoprolol 12.5mg  BID, lisinopril 5mg  daily  -- echo pending  HLD: LDL 147 -- on pravastatin in the past. Has had trouble with atorva/crestor in the past.  Will plan for referral to the lipid clinic for consideration of PCSK9s -- needs FLP/LFTs in 8 weeks   Hypothyroidism: on synthroid  OSA on Cpap:  reports compliance   PreDM: Hgb A1c 5.8  Tobacco use: cessation  For questions or updates, please contact [facility] Please consult [email or web address] for contact info under        Signed, , NP  11/26/2020, 7:50 AM

## 2020-11-26 NOTE — Plan of Care (Signed)

## 2020-11-26 NOTE — H&P (View-Only) (Signed)
Progress Note  Patient Name: John Randolph Date of Encounter: 11/26/2020  [facility] Cardiologist: None   Subjective   No complaints.  Inpatient Medications    Scheduled Meds:  aspirin EC  81 mg Oral Daily   atorvastatin  80 mg Oral Daily   levothyroxine  100 mcg Oral QAC breakfast   lisinopril  5 mg Oral Daily   metoprolol tartrate  12.5 mg Oral BID   ticagrelor  90 mg Oral BID   Continuous Infusions:  sodium chloride 15 mL/hr at 11/25/20 0600   heparin 1,300 Units/hr (11/25/20 2233)   nitroGLYCERIN Stopped (11/25/20 0941)   PRN Meds: acetaminophen, nicotine polacrilex, nitroGLYCERIN, ondansetron (ZOFRAN) IV   Vital Signs    Vitals:   11/25/20 1700 11/25/20 2000 11/26/20 0036 11/26/20 0512  BP: 121/84 132/78 128/86 107/70  Pulse: 61 70 64 (!) 58  Resp: 18 18 16 18   Temp: 98.3 F (36.8 C) 97.7 F (36.5 C) 98 F (36.7 C) 98.2 F (36.8 C)  TempSrc: Oral Oral Oral Oral  SpO2:  96% 96% 96%  Weight:      Height:        Intake/Output Summary (Last 24 hours) at 11/26/2020 0750 Last data filed at 11/25/2020 1604 Gross per 24 hour  Intake 257.9 ml  Output --  Net 257.9 ml   Last 3 Weights 11/25/2020 01/04/2020 03/16/2017  Weight (lbs) 171 lb 11.2 oz 171 lb 3.2 oz 171 lb  Weight (kg) 77.883 kg 77.656 kg 77.565 kg      Telemetry    SR-->SB - Personally Reviewed  ECG    No new tracing this morning  Physical Exam   GEN: No acute distress.   Neck: No JVD Cardiac: RRR, no murmurs, rubs, or gallops.  Respiratory: Clear to auscultation bilaterally. GI: Soft, nontender, non-distended  MS: No edema; No deformity. Neuro:  Nonfocal  Psych: Normal affect   Labs    High Sensitivity Troponin:   Recent Labs  Lab 11/24/20 2352 11/25/20 0152  TROPONINIHS 1,050* 1,273*     Chemistry Recent Labs  Lab 11/24/20 2352 11/25/20 0411  NA 135 136  K 3.2* 3.5  CL 103 103  CO2 25 24  GLUCOSE 112* 108*  BUN 13 11  CREATININE 1.00 0.88  CALCIUM  8.8* 9.1  PROT 7.0  --   ALBUMIN 3.9  --   AST 33  --   ALT 16  --   ALKPHOS 80  --   BILITOT 0.6  --   GFRNONAA >60 >60  ANIONGAP 7 9    Lipids  Recent Labs  Lab 11/24/20 2352  CHOL 234*  TRIG 247*  HDL 38*  LDLCALC 147*  CHOLHDL 6.2    Hematology Recent Labs  Lab 11/24/20 2352 11/25/20 0411 11/26/20 0427  WBC 10.5 8.2 8.3  RBC 5.19 5.14 5.20  HGB 16.2 16.0 15.9  HCT 46.0 45.6 45.9  MCV 88.6 88.7 88.3  MCH 31.2 31.1 30.6  MCHC 35.2 35.1 34.6  RDW 13.6 13.7 13.6  PLT 239 213 214   Thyroid  Recent Labs  Lab 11/25/20 0411  TSH 1.676  FREET4 1.02    BNP Recent Labs  Lab 11/25/20 0411  BNP 76.3    DDimer No results for input(s): DDIMER in the last 168 hours.   Radiology    DG Chest 2 View  Result Date: 11/25/2020 CLINICAL DATA:  Chest pain.  Shortness of breath. EXAM: CHEST - 2 VIEW COMPARISON:  Chest CT  01/04/2020 FINDINGS: The heart is normal in size. Aortic atherosclerosis and tortuosity. Streaky bandlike opacity in the right middle lobe. Mild bronchial thickening. Calcified granuloma in the right mid lung zone. No pulmonary edema, pleural effusion, or pneumothorax. No acute osseous abnormalities are seen. IMPRESSION: 1. Streaky bandlike right middle lobe opacity, favor atelectasis. 2. Mild bronchial thickening, chronic. Electronically Signed   By: Narda Rutherford M.D.   On: 11/25/2020 00:00    Cardiac Studies   Echo: pending  Patient Profile     65 y.o. male with PMH of HLD, OSA on Cpap, hypothyroidism and tobacco use who presented with chest pain and found to have a NSTEMI.   Assessment & Plan    NSTEMI: hsTn peaked 1273. Hx of coronary calcifications on CT. He was admitted and loaded with Brilinta on 11/13. Planned for cardiac cath today. No chest pain overnight. -- on IV heparin,nitro, metoprolol 12.5mg  BID, lisinopril 5mg  daily  -- echo pending  HLD: LDL 147 -- on pravastatin in the past. Has had trouble with atorva/crestor in the past.  Will plan for referral to the lipid clinic for consideration of PCSK9s -- needs FLP/LFTs in 8 weeks   Hypothyroidism: on synthroid  OSA on Cpap:  reports compliance   PreDM: Hgb A1c 5.8  Tobacco use: cessation  For questions or updates, please contact [facility] Please consult [email or web address] for contact info under        Signed, , NP  11/26/2020, 7:50 AM

## 2020-11-26 NOTE — TOC Benefit Eligibility Note (Signed)
Patient Product/process development scientist completed.    The patient is currently admitted and upon discharge could be taking Brilinta 90 mg.  The current 30 day co-pay is, $25.00 .[employer]stra[employer], the mfr of BRILINTA 90 MG TABLET paid 15.00 toward plan copay   The patient is insured through [employer]     John Randolph, CPhT Pharmacy Patient Advocate Specialist [facility] Patient Advocate Team Direct Number: [phone]  Fax: [phone]

## 2020-11-26 NOTE — Interval H&P Note (Signed)
History and Physical Interval Note:  11/26/2020 1:33 PM  John Randolph  has presented today for surgery, with the diagnosis of NSTEMI.  The various methods of treatment have been discussed with the patient and family. After consideration of risks, benefits and other options for treatment, the patient has consented to  Procedure(s): LEFT HEART CATH AND CORONARY ANGIOGRAPHY (N/A) as a surgical intervention.  The patient's history has been reviewed, patient examined, no change in status, stable for surgery.  I have reviewed the patient's chart and labs.  Questions were answered to the patient's satisfaction.   Cath Lab Visit (complete for each Cath Lab visit)  Clinical Evaluation Leading to the Procedure:   ACS: Yes.    Non-ACS:    Anginal Classification: CCS IV  Anti-ischemic medical therapy: No Therapy  Non-Invasive Test Results: No non-invasive testing performed  Prior CABG: No previous CABG        Theron Arista [facility] 11/26/2020 1:33 PM

## 2020-11-27 ENCOUNTER — Encounter (HOSPITAL_COMMUNITY): Payer: Self-pay | Admitting: Cardiology

## 2020-11-27 ENCOUNTER — Other Ambulatory Visit (HOSPITAL_COMMUNITY): Payer: Self-pay

## 2020-11-27 ENCOUNTER — Telehealth: Payer: Self-pay

## 2020-11-27 DIAGNOSIS — Z720 Tobacco use: Secondary | ICD-10-CM

## 2020-11-27 DIAGNOSIS — E782 Mixed hyperlipidemia: Secondary | ICD-10-CM

## 2020-11-27 LAB — BASIC METABOLIC PANEL
Anion gap: 8 (ref 5–15)
BUN: 10 mg/dL (ref 8–23)
CO2: 21 mmol/L — ABNORMAL LOW (ref 22–32)
Calcium: 8.2 mg/dL — ABNORMAL LOW (ref 8.9–10.3)
Chloride: 104 mmol/L (ref 98–111)
Creatinine, Ser: 0.93 mg/dL (ref 0.61–1.24)
GFR, Estimated: >60 mL/min (ref >60)
Glucose, Bld: 89 mg/dL (ref 70–99)
Potassium: 3.7 mmol/L (ref 3.5–5.1)
Sodium: 133 mmol/L — ABNORMAL LOW (ref 135–145)

## 2020-11-27 LAB — CBC
HCT: 43.4 % (ref 39.0–52.0)
Hemoglobin: 14.9 g/dL (ref 13.0–17.0)
MCH: 30.7 pg (ref 26.0–34.0)
MCHC: 34.3 g/dL (ref 30.0–36.0)
MCV: 89.3 fL (ref 80.0–100.0)
Platelets: 225 10*3/uL (ref 150–400)
RBC: 4.86 MIL/uL (ref 4.22–5.81)
RDW: 13.7 % (ref 11.5–15.5)
WBC: 6.4 10*3/uL (ref 4.0–10.5)
nRBC: 0 % (ref 0.0–0.2)

## 2020-11-27 LAB — MAGNESIUM: Magnesium: 1.9 mg/dL (ref 1.7–2.4)

## 2020-11-27 MED ORDER — TADALAFIL 5 MG PO TABS
5.0000 mg | ORAL_TABLET | Freq: Every day | ORAL | 0 refills | Status: AC | PRN
  Filled 2020-11-27: qty 10, 10d supply, fill #0

## 2020-11-27 MED ORDER — ATORVASTATIN CALCIUM 80 MG PO TABS
80.0000 mg | ORAL_TABLET | Freq: Every day | ORAL | 11 refills | Status: DC
  Filled 2020-11-27: qty 30, 30d supply, fill #0

## 2020-11-27 MED ORDER — NICOTINE POLACRILEX 2 MG MT GUM
2.0000 mg | CHEWING_GUM | OROMUCOSAL | 0 refills | Status: AC | PRN
  Filled 2020-11-27: qty 100, fill #0

## 2020-11-27 MED ORDER — METOPROLOL SUCCINATE ER 25 MG PO TB24
25.0000 mg | ORAL_TABLET | Freq: Every day | ORAL | 3 refills | Status: DC
  Filled 2020-11-27: qty 30, 30d supply, fill #0

## 2020-11-27 MED ORDER — NITROGLYCERIN 0.4 MG SL SUBL
0.4000 mg | SUBLINGUAL_TABLET | SUBLINGUAL | 3 refills | Status: AC | PRN
  Filled 2020-11-27: qty 25, 7d supply, fill #0

## 2020-11-27 MED ORDER — ASPIRIN 81 MG PO TBEC
81.0000 mg | DELAYED_RELEASE_TABLET | Freq: Every day | ORAL | 3 refills | Status: DC
  Filled 2020-11-27: qty 90, 90d supply, fill #0

## 2020-11-27 MED ORDER — TICAGRELOR 90 MG PO TABS
90.0000 mg | ORAL_TABLET | Freq: Two times a day (BID) | ORAL | 3 refills | Status: DC
  Filled 2020-11-27: qty 60, 30d supply, fill #0

## 2020-11-27 MED ORDER — METOPROLOL SUCCINATE ER 25 MG PO TB24
25.0000 mg | ORAL_TABLET | Freq: Every day | ORAL | Status: DC
  Administered 2020-11-27: 25 mg via ORAL
  Filled 2020-11-27: qty 1

## 2020-11-27 MED FILL — Lidocaine HCl Local Preservative Free (PF) Inj 1%: INTRAMUSCULAR | Qty: 30 | Status: AC

## 2020-11-27 NOTE — Progress Notes (Signed)
CARDIAC REHAB PHASE I   PRE:  Rate/Rhythm: 69 SR    BP: sitting 123/67    SaO2: 97 RA  MODE:  Ambulation: 430 ft   POST:  Rate/Rhythm: 90 SR    BP: sitting 135/76     SaO2: 98 RA  Tolerated well. Feels good. Discussed MI, stent, Brilinta, restrictions, diet, smoking cessation, exercise, NTG, and CRPII. Pt is calculating how much change he wants to make. Not ready to quit smoking but wants to cut back and smoke his "air cigarettes" that are low in nicotine. Will refer to [place] CRPII. He understands the importance of Brilinta but not a fan of taking meds. Encouraged a pill box and setting an alarm. He is widowed and cares for his daughter with down syndrome. [phone]  Harriet Masson CES, ACSM 11/27/2020 9:04 AM

## 2020-11-27 NOTE — Discharge Summary (Signed)
Discharge Summary    Patient ID: John Randolph MRN: [id number]; DOB: 1955/02/27  Admit date: 11/24/2020 Discharge date: 11/27/2020  PCP:  John Raider, MD   [facility] Providers Cardiologist:  John Schultz, MD   Discharge Diagnoses    Principal Problem:   NSTEMI (non-ST elevated myocardial infarction) [facility]) Active Problems:   Mixed hyperlipidemia   Coronary artery disease involving native coronary artery of native heart with angina pectoris (HCC)   Obstructive sleep apnea   Tobacco use    Diagnostic Studies/Procedures    Left heart cath 11/26/20:   Prox RCA lesion is 50% stenosed.   Prox LAD to Mid LAD lesion is 50% stenosed.   Mid LAD lesion is 50% stenosed.   1st Mrg lesion is 90% stenosed.   Prox Cx lesion is 40% stenosed.   A stent was successfully placed.   A drug-eluting stent was successfully placed using a SYNERGY XD 3.0X38.   Post intervention, there is a 0% residual stenosis.   Post intervention, there is a 0% residual stenosis.   LV end diastolic pressure is normal.   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 Moderate nonobstructive disease in the LAD and RCA Normal LVEDP Successful PCI of the LCx/OM1 with DES x 1   Plan: DAPT for one year. Aggressive risk factor modification. Anticipate DC in am. _____________   Echo 11/26/20:  1. Left ventricular ejection fraction, by estimation, is 50 to 55%. The  left ventricle has low normal function. The left ventricle has no regional  wall motion abnormalities. Left ventricular diastolic parameters are  consistent with Grade I diastolic  dysfunction (impaired relaxation).   2. Right ventricular systolic function is mildly reduced. The right  ventricular size is normal. There is normal pulmonary artery systolic  pressure.   3. The mitral valve is normal in structure. No evidence of mitral valve  regurgitation. No evidence of mitral stenosis.   4. The aortic valve is  normal in structure. Aortic valve regurgitation is  not visualized. Aortic valve sclerosis is present, with no evidence of  aortic valve stenosis.   5. The inferior vena cava is normal in size with greater than 50%  respiratory variability, suggesting right atrial pressure of 3 mmHg.    History of Present Illness     John Randolph is a 65 y.o. male with hyperlipidemia, OSA on CPAP (uses ~ once/week), hypothyroidism, and tobacco abuse who is being seen 11/25/2020 for the evaluation of NSTEMI.  John Randolph has a known history of hyperlipidemia for which she takes pravastatin.  He previously reported muscle aches with atorvastatin and rosuvastatin.  While there have been no clinical manifestations of coronary artery disease, he recently had a lung cancer screening CT that did note coronary calcifications in the distribution of the LAD and left circumflex.  He is smoking about 2 packs/day of [place] whites.  He previously smoked [place]ora reds and in an effort to quit switched to [facility] because they have less nicotine.  With this change he has noticed that he is actually smoking more, but does desire to quit.   Overall John Randolph tells me until yesterday evening he has been feeling well.  For the last 6 months or so he has often been getting a burning chest discomfort after eating dinner, but this always resolves within 10 minutes after taking a couple of Tums.  He has never had this "indigestion" feeling when exerting himself and it has always resolved with  Tums.  This evening around 930 he again developed a burning chest discomfort, but this time he felt it was much more intense.  He also noticed some left arm pain shortly after this chest discomfort began.  He again tried to take some Tums but the discomfort did not resolve.  He denies any associated shortness of breath, nausea, diaphoresis.  Given the significant change in his symptoms he called 911 and was brought to the emergency department.    On arrival to the emergency department he was hemodynamically stable.  His EKG was notable for relatively diffuse ST depressions.  His troponin was elevated at over thousand.  He continued to have chest discomfort in the emergency department and it largely resolved with sublingual nitro.  He was started on a nitroglycerin drip at 5 mcgs, and by the time I saw him he was completely chest pain-free.  Hospital Course     Consultants: none  NSTEMI HS troponin peaked at 1273. Hx of coronary calcifications on CT. He was admitted and loaded with brilinta 11/25/20. Heart cath showed culprit lesion was large OM1 successfully treated with DES. He has moderate nonobstructive disease in the LAD and RCA treated medically. He tolerated the procedure well and was continued on ASA and brilinta. Started on toprol 25 mg. He tolerated the procedure well.    Hyperlipidemia with LDL goal < 70 11/24/2020: Cholesterol 234; HDL 38; LDL Cholesterol 147; Triglycerides 247; VLDL 49 Previously on pravastatin. Had side effects with lipitor and crestor in the past. Will refer to lipid clinic at follow up for PCSK9i. Also, he tolerated lipitor here, so will need to follow up on this. Discharged on 30 day supply.   OSA on CPAP Compliant   Prediabetes A1c 5.8%   Tobacco use Encouraged cessation   Hypothyroidism  Continue compliance with synthroid.   Pt seen and examined by John Randolph and deemed stable for discharge. Follow up has been arranged.     Did the patient have an acute coronary syndrome (MI, NSTEMI, STEMI, etc) this admission?:  Yes                               AHA/ACC Clinical Performance & Quality Measures: Aspirin prescribed? - Yes ADP Receptor Inhibitor (Plavix/Clopidogrel, Brilinta/Ticagrelor or Effient/Prasugrel) prescribed (includes medically managed patients)? - Yes Beta Blocker prescribed? - Yes High Intensity Statin (Lipitor 40-80mg  or Crestor 20-40mg ) prescribed? - Yes EF assessed  during THIS hospitalization? - Yes For EF <40%, was ACEI/ARB prescribed? - Not Applicable (EF >/= 40%) For EF <40%, Aldosterone Antagonist (Spironolactone or Eplerenone) prescribed? - Not Applicable (EF >/= 40%) Cardiac Rehab Phase II ordered (including medically managed patients)? - Yes       The patient will be scheduled for a TOC follow up appointment in 7-10 days.  A message has been sent to the [facility] and Scheduling Pool at the office where the patient should be seen for follow up.  _____________  Discharge Vitals Blood pressure 126/74, pulse 61, temperature 97.6 F (36.4 C), temperature source Oral, resp. rate 19, height  (1.702 m), weight 77.9 kg, SpO2 99 %.  Filed Weights   11/25/20 0504  Weight: 77.9 kg    Labs & Radiologic Studies    CBC Recent Labs    11/24/20 2352 11/25/20 0411 11/26/20 0427 11/27/20 0220  WBC 10.5   < > 8.3 6.4  NEUTROABS 7.3  --   --   --  HGB 16.2   < > 15.9 14.9  HCT 46.0   < > 45.9 43.4  MCV 88.6   < > 88.3 89.3  PLT 239   < > 214 225   < > = values in this interval not displayed.   Basic Metabolic Panel Recent Labs    23/76/28 0411 11/26/20 1634 11/27/20 0220  NA 136  --  133*  K 3.5  --  3.7  CL 103  --  104  CO2 24  --  21*  GLUCOSE 108*  --  89  BUN 11  --  10  CREATININE 0.88 0.97 0.93  CALCIUM 9.1  --  8.2*  MG  --   --  1.9   Liver Function Tests Recent Labs    11/24/20 2352  AST 33  ALT 16  ALKPHOS 80  BILITOT 0.6  PROT 7.0  ALBUMIN 3.9   No results for input(s): LIPASE, AMYLASE in the last 72 hours. High Sensitivity Troponin:   Recent Labs  Lab 11/24/20 2352 11/25/20 0152  TROPONINIHS 1,050* 1,273*    BNP Invalid input(s): POCBNP D-Dimer No results for input(s): DDIMER in the last 72 hours. Hemoglobin A1C Recent Labs    11/24/20 2352  HGBA1C 5.8*   Fasting Lipid Panel Recent Labs    11/24/20 2352  CHOL 234*  HDL 38*  LDLCALC 147*  TRIG 247*  CHOLHDL 6.2   Thyroid Function  Tests Recent Labs    11/25/20 0411  TSH 1.676   _____________  DG Chest 2 View  Result Date: 11/25/2020 CLINICAL DATA:  Chest pain.  Shortness of breath. EXAM: CHEST - 2 VIEW COMPARISON:  Chest CT 01/04/2020 FINDINGS: The heart is normal in size. Aortic atherosclerosis and tortuosity. Streaky bandlike opacity in the right middle lobe. Mild bronchial thickening. Calcified granuloma in the right mid lung zone. No pulmonary edema, pleural effusion, or pneumothorax. No acute osseous abnormalities are seen. IMPRESSION: 1. Streaky bandlike right middle lobe opacity, favor atelectasis. 2. Mild bronchial thickening, chronic. Electronically Signed   By: Narda Rutherford M.D.   On: 11/25/2020 00:00   CARDIAC CATHETERIZATION  Result Date: 11/26/2020   Prox RCA lesion is 50% stenosed.   Prox LAD to Mid LAD lesion is 50% stenosed.   Mid LAD lesion is 50% stenosed.   1st Mrg lesion is 90% stenosed.   Prox Cx lesion is 40% stenosed.   A stent was successfully placed.   A drug-eluting stent was successfully placed using a SYNERGY XD 3.0X38.   Post intervention, there is a 0% residual stenosis.   Post intervention, there is a 0% residual stenosis.   LV end diastolic pressure is normal. 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 Moderate nonobstructive disease in the LAD and RCA Normal LVEDP Successful PCI of the LCx/OM1 with DES x 1 Plan: DAPT for one year. Aggressive risk factor modification. Anticipate DC in am.   ECHOCARDIOGRAM COMPLETE  Result Date: 11/26/2020    ECHOCARDIOGRAM REPORT   Patient Name:   LJ MIYAMOTO Date of Exam: 11/26/2020 Medical Rec #:  [id number]         Height:       67.0 in Accession #:    [id number]        Weight:       171.7 lb Date of Birth:  08-May-1955        BSA:          1.895 m Patient Age:  64 years          BP:           132/78 mmHg Patient Gender: M                 HR:           58 bpm. Exam Location:  Inpatient Procedure: 2D Echo, Cardiac  Doppler and Color Doppler Indications:    NSTEMI  History:        Patient has no prior history of Echocardiogram examinations.                 CAD; Risk Factors:Sleep Apnea.  Sonographer:    Cleatis Polka Referring Phys: [id number] CHRISTOPHER A WROBEL IMPRESSIONS  1. Left ventricular ejection fraction, by estimation, is 50 to 55%. The left ventricle has low normal function. The left ventricle has no regional wall motion abnormalities. Left ventricular diastolic parameters are consistent with Grade I diastolic dysfunction (impaired relaxation).  2. Right ventricular systolic function is mildly reduced. The right ventricular size is normal. There is normal pulmonary artery systolic pressure.  3. The mitral valve is normal in structure. No evidence of mitral valve regurgitation. No evidence of mitral stenosis.  4. The aortic valve is normal in structure. Aortic valve regurgitation is not visualized. Aortic valve sclerosis is present, with no evidence of aortic valve stenosis.  5. The inferior vena cava is normal in size with greater than 50% respiratory variability, suggesting right atrial pressure of 3 mmHg. Comparison(s): No prior Echocardiogram. FINDINGS  Left Ventricle: Left ventricular ejection fraction, by estimation, is 50 to 55%. The left ventricle has low normal function. The left ventricle has no regional wall motion abnormalities. The left ventricular internal cavity size was normal in size. There is no left ventricular hypertrophy. Left ventricular diastolic parameters are consistent with Grade I diastolic dysfunction (impaired relaxation). Right Ventricle: The right ventricular size is normal. No increase in right ventricular wall thickness. Right ventricular systolic function is mildly reduced. There is normal pulmonary artery systolic pressure. The tricuspid regurgitant velocity is 2.26 m/s, and with an assumed right atrial pressure of 3 mmHg, the estimated right ventricular systolic pressure is 23.4 mmHg.  Left Atrium: Left atrial size was normal in size. Right Atrium: Right atrial size was normal in size. Pericardium: There is no evidence of pericardial effusion. Presence of epicardial fat layer. Mitral Valve: The mitral valve is normal in structure. No evidence of mitral valve regurgitation. No evidence of mitral valve stenosis. Tricuspid Valve: The tricuspid valve is normal in structure. Tricuspid valve regurgitation is trivial. No evidence of tricuspid stenosis. Aortic Valve: The aortic valve is normal in structure. There is mild aortic valve annular calcification. Aortic valve regurgitation is not visualized. Aortic valve sclerosis is present, with no evidence of aortic valve stenosis. Aortic valve peak gradient measures 5.0 mmHg. Pulmonic Valve: The pulmonic valve was normal in structure. Pulmonic valve regurgitation is not visualized. No evidence of pulmonic stenosis. Aorta: The aortic root is normal in size and structure. Venous: The inferior vena cava is normal in size with greater than 50% respiratory variability, suggesting right atrial pressure of 3 mmHg. IAS/Shunts: No atrial level shunt detected by color flow Doppler.  LEFT VENTRICLE PLAX 2D LVIDd:         5.00 cm      Diastology LVIDs:         4.10 cm      LV e' medial:    6.42 cm/s LV PW:  1.00 cm      LV E/e' medial:  8.1 LV IVS:        1.00 cm      LV e' lateral:   5.77 cm/s LVOT diam:     2.00 cm      LV E/e' lateral: 9.1 LV SV:         61 LV SV Index:   32 LVOT Area:     3.14 cm  LV Volumes (MOD) LV vol d, MOD A2C: 98.8 ml LV vol d, MOD A4C: 112.0 ml LV vol s, MOD A2C: 46.5 ml LV vol s, MOD A4C: 53.6 ml LV SV MOD A2C:     52.3 ml LV SV MOD A4C:     112.0 ml LV SV MOD BP:      56.6 ml RIGHT VENTRICLE            IVC RV Basal diam:  3.00 cm    IVC diam: 1.50 cm RV Mid diam:    2.30 cm RV S prime:     7.94 cm/s TAPSE (M-mode): 1.6 cm LEFT ATRIUM             Index        RIGHT ATRIUM           Index LA diam:        3.50 cm 1.85 cm/m   RA Area:      10.70 cm LA Vol (A2C):   45.0 ml 23.74 ml/m  RA Volume:   22.60 ml  11.92 ml/m LA Vol (A4C):   31.9 ml 16.83 ml/m LA Biplane Vol: 38.4 ml 20.26 ml/m  AORTIC VALVE AV Area (Vmax): 2.56 cm AV Vmax:        112.00 cm/s AV Peak Grad:   5.0 mmHg LVOT Vmax:      91.40 cm/s LVOT Vmean:     63.400 cm/s LVOT VTI:       0.195 m  AORTA Ao Root diam: 3.30 cm Ao Asc diam:  3.00 cm MITRAL VALVE               TRICUSPID VALVE MV Area (PHT): 2.52 cm    TR Peak grad:   20.4 mmHg MV Decel Time: 301 msec    TR Vmax:        226.00 cm/s MV E velocity: 52.30 cm/s MV A velocity: 57.40 cm/s  SHUNTS MV E/A ratio:  0.91        Systemic VTI:  0.20 m                            Systemic Diam: 2.00 cm Kardie Tobb DO Electronically signed by Thomasene Ripple DO Signature Date/Time: 11/26/2020/11:34:38 AM    Final    Disposition   Pt is being discharged home today in good condition.  Follow-up Plans & Appointments     Follow-up Information     Ronney Asters, NP Follow up on 12/17/2020.   Specialty: Cardiology Why: 2:20 pm for post cath Contact information: [address] [place] [address] [phone]                Discharge Instructions     AMB Referral to Advanced Lipid Disorders Clinic   Complete by: As directed    Has tried crestor/lipitor/simvastatin in the past - LDL 147 (on pravastatin PTA) - would be interested in non-statin alternatives   Internal Lipid Clinic Referral Scheduling  Internal lipid  clinic referrals are providers within [facility], who wish to refer established patients for routine management (help in starting PCSK9 inhibitor therapy) or advanced therapies.  Internal MD referral criteria:              1. All patients with LDL>190 mg/dL  2. All patients with Triglycerides >500 mg/dL  3. Patients with suspected or confirmed heterozygous familial hyperlipidemia (HeFH) or homozygous familial hyperlipidemia (HoFH)  4. Patients with family history of suspicious for genetic  dyslipidemia desiring genetic testing  5. Patients refractory to standard guideline based therapy  6. Patients with statin intolerance (failed 2 statins, one of which must be a high potency statin)  7. Patients who the provider desires to be seen by MD   Internal PharmD referral criteria:   1. Follow-up patients for medication management  2. Follow-up for compliance monitoring  3. Patients for drug education  4. Patients with statin intolerance  5. PCSK9 inhibitor education and prior authorization approvals  6. Patients with triglycerides <500 mg/dL  External Lipid Clinic Referral  External lipid clinic referrals are for providers outside of [facility], considered new clinic patients - automatically routed to MD schedule   Amb Referral to Cardiac Rehabilitation   Complete by: As directed    Diagnosis:  Coronary Stents NSTEMI PTCA     After initial evaluation and assessments completed: Virtual Based Care may be provided alone or in conjunction with Phase 2 Cardiac Rehab based on patient barriers.: Yes   Diet - low sodium heart healthy   Complete by: As directed    Discharge instructions   Complete by: As directed    No driving for 2 days. No lifting over 5 lbs for 1 week. No sexual activity for 1 week. You may return to work in 2 weeks. Keep procedure site clean & dry. If you notice increased pain, swelling, bleeding or pus, call/return!  You may shower, but no soaking baths/hot tubs/pools for 1 week.   Increase activity slowly   Complete by: As directed        Discharge Medications   Allergies as of 11/27/2020       Reactions   Crestor [rosuvastatin] Other (See Comments)   Aches   Hydrochlorothiazide Other (See Comments)   Lipitor [atorvastatin] Other (See Comments)   Aches   Other Other (See Comments)   Zocor [simvastatin] Other (See Comments)   aches        Medication List     STOP taking these medications    pravastatin 80 MG tablet Commonly known as:  PRAVACHOL       TAKE these medications    aspirin 81 MG EC tablet Take 1 tablet (81 mg total) by mouth daily. Swallow whole. Start taking on: November 28, 2020   atorvastatin 80 MG tablet Commonly known as: LIPITOR Take 1 tablet (80 mg total) by mouth daily. Start taking on: November 28, 2020   levothyroxine 100 MCG tablet Commonly known as: SYNTHROID Take 100 mcg by mouth daily before breakfast.   LORazepam 0.5 MG tablet Commonly known as: ATIVAN Take 0.5 mg by mouth at bedtime as needed for sleep.   metoprolol succinate 25 MG 24 hr tablet Commonly known as: TOPROL-XL Take 1 tablet (25 mg total) by mouth daily. Start taking on: November 28, 2020   nicotine polacrilex 2 MG gum Commonly known as: NICORETTE Take 1 each (2 mg total) by mouth as needed for smoking cessation.   nitroGLYCERIN 0.4 MG SL tablet Commonly known as:  NITROSTAT Place 1 tablet (0.4 mg total) under the tongue every 5 (five) minutes as needed for chest pain.   tadalafil 5 MG tablet Commonly known as: CIALIS Take 1 tablet (5 mg total) by mouth daily as needed for erectile dysfunction. Do not take in combination with nitro. Nitro can be taken 48 hrs after cialis. What changed: additional instructions   ticagrelor 90 MG Tabs tablet Commonly known as: BRILINTA Take 1 tablet (90 mg total) by mouth 2 (two) times daily.           Outstanding Labs/Studies   Lipids   Duration of Discharge Encounter   Greater than 30 minutes including physician time.  Signed, Roe Rutherford Margeret Stachnik, PA 11/27/2020, 11:28 AM

## 2020-11-27 NOTE — Telephone Encounter (Signed)
**Note De-identified Ivori Storr Obfuscation** -----  **Note De-Identified Halee Glynn Obfuscation** Message from Marcelino Duster, [place] sent at 11/27/2020 11:28 AM EST ----- Pt discharging today and will need a TOC call.   Thanks Angie

## 2020-11-27 NOTE — Plan of Care (Signed)

## 2020-11-27 NOTE — Telephone Encounter (Signed)
**Note De-Identified John Randolph Obfuscation** The pt has a post hospital f/u scheduled with Edd Fabian, NP on 12/17/2020 at 2:20 at our [facility] office. Forwarding this TOC call to [facility] TOC pool to contact the pt once he has been discharged.

## 2020-11-27 NOTE — Progress Notes (Signed)
Progress Note  Patient Name: John Randolph Date of Encounter: 11/27/2020  [facility] Cardiologist: None   Subjective   No complaints.  Inpatient Medications    Scheduled Meds:  aspirin EC  81 mg Oral Daily   atorvastatin  80 mg Oral Daily   heparin  5,000 Units Subcutaneous Q8H   levothyroxine  100 mcg Oral QAC breakfast   metoprolol tartrate  12.5 mg Oral BID   sodium chloride flush  3 mL Intravenous Q12H   ticagrelor  90 mg Oral BID   Continuous Infusions:  sodium chloride Stopped (11/26/20 1521)   sodium chloride     PRN Meds: sodium chloride, acetaminophen, nicotine polacrilex, nitroGLYCERIN, ondansetron (ZOFRAN) IV, sodium chloride flush   Vital Signs    Vitals:   11/26/20 1604 11/26/20 1659 11/26/20 2000 11/27/20 0451  BP: 101/63 102/63 102/74 123/67  Pulse: (!) 56 67 65 61  Resp: 17 18 18 19   Temp:   97.6 F (36.4 C) 97.6 F (36.4 C)  TempSrc:   Oral Oral  SpO2:   98% 99%  Weight:      Height:        Intake/Output Summary (Last 24 hours) at 11/27/2020 0847 Last data filed at 11/26/2020 1521 Gross per 24 hour  Intake 1615.15 ml  Output --  Net 1615.15 ml    Last 3 Weights 11/25/2020 01/04/2020 03/16/2017  Weight (lbs) 171 lb 11.2 oz 171 lb 3.2 oz 171 lb  Weight (kg) 77.883 kg 77.656 kg 77.565 kg      Telemetry    SR-->SB - Personally Reviewed  ECG    No new tracing this morning  Physical Exam   GEN: No acute distress.   Neck: No JVD Cardiac: RRR, no murmurs, rubs, or gallops.  Respiratory: Clear to auscultation bilaterally. GI: Soft, nontender, non-distended  MS: No edema; No deformity. Neuro:  Nonfocal  Psych: Normal affect   Labs    High Sensitivity Troponin:   Recent Labs  Lab 11/24/20 2352 11/25/20 0152  TROPONINIHS 1,050* 1,273*      Chemistry Recent Labs  Lab 11/24/20 2352 11/25/20 0411 11/26/20 1634 11/27/20 0220  NA 135 136  --  133*  K 3.2* 3.5  --  3.7  CL 103 103  --  104  CO2 25 24  --  21*   GLUCOSE 112* 108*  --  89  BUN 13 11  --  10  CREATININE 1.00 0.88 0.97 0.93  CALCIUM 8.8* 9.1  --  8.2*  MG  --   --   --  1.9  PROT 7.0  --   --   --   ALBUMIN 3.9  --   --   --   AST 33  --   --   --   ALT 16  --   --   --   ALKPHOS 80  --   --   --   BILITOT 0.6  --   --   --   GFRNONAA >60 >60 >60 >60  ANIONGAP 7 9  --  8     Lipids  Recent Labs  Lab 11/24/20 2352  CHOL 234*  TRIG 247*  HDL 38*  LDLCALC 147*  CHOLHDL 6.2     Hematology Recent Labs  Lab 11/24/20 2352 11/25/20 0411 11/26/20 0427  WBC 10.5 8.2 8.3  RBC 5.19 5.14 5.20  HGB 16.2 16.0 15.9  HCT 46.0 45.6 45.9  MCV 88.6 88.7 88.3  MCH 31.2  31.1 30.6  MCHC 35.2 35.1 34.6  RDW 13.6 13.7 13.6  PLT 239 213 214    Thyroid  Recent Labs  Lab 11/25/20 0411  TSH 1.676  FREET4 1.02     BNP Recent Labs  Lab 11/25/20 0411  BNP 76.3     DDimer No results for input(s): DDIMER in the last 168 hours.   Radiology    CARDIAC CATHETERIZATION  Result Date: 11/26/2020   Prox RCA lesion is 50% stenosed.   Prox LAD to Mid LAD lesion is 50% stenosed.   Mid LAD lesion is 50% stenosed.   1st Mrg lesion is 90% stenosed.   Prox Cx lesion is 40% stenosed.   A stent was successfully placed.   A drug-eluting stent was successfully placed using a SYNERGY XD 3.0X38.   Post intervention, there is a 0% residual stenosis.   Post intervention, there is a 0% residual stenosis.   LV end diastolic pressure is normal. 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 Moderate nonobstructive disease in the LAD and RCA Normal LVEDP Successful PCI of the LCx/OM1 with DES x 1 Plan: DAPT for one year. Aggressive risk factor modification. Anticipate DC in am.   ECHOCARDIOGRAM COMPLETE  Result Date: 11/26/2020    ECHOCARDIOGRAM REPORT   Patient Name:   John Randolph Date of Exam: 11/26/2020 Medical Rec #:  [id number]         Height:       67.0 in Accession #:    [id number]        Weight:       171.7 lb  Date of Birth:  11-24-1955        BSA:          1.895 m Patient Age:    65 years          BP:           132/78 mmHg Patient Gender: M                 HR:           58 bpm. Exam Location:  Inpatient Procedure: 2D Echo, Cardiac Doppler and Color Doppler Indications:    NSTEMI  History:        Patient has no prior history of Echocardiogram examinations.                 CAD; Risk Factors:Sleep Apnea.  Sonographer:    Cleatis Polka Referring Phys: [id number] CHRISTOPHER A WROBEL IMPRESSIONS  1. Left ventricular ejection fraction, by estimation, is 50 to 55%. The left ventricle has low normal function. The left ventricle has no regional wall motion abnormalities. Left ventricular diastolic parameters are consistent with Grade I diastolic dysfunction (impaired relaxation).  2. Right ventricular systolic function is mildly reduced. The right ventricular size is normal. There is normal pulmonary artery systolic pressure.  3. The mitral valve is normal in structure. No evidence of mitral valve regurgitation. No evidence of mitral stenosis.  4. The aortic valve is normal in structure. Aortic valve regurgitation is not visualized. Aortic valve sclerosis is present, with no evidence of aortic valve stenosis.  5. The inferior vena cava is normal in size with greater than 50% respiratory variability, suggesting right atrial pressure of 3 mmHg. Comparison(s): No prior Echocardiogram. FINDINGS  Left Ventricle: Left ventricular ejection fraction, by estimation, is 50 to 55%. The left ventricle has low normal function. The left ventricle has no regional wall motion abnormalities.  The left ventricular internal cavity size was normal in size. There is no left ventricular hypertrophy. Left ventricular diastolic parameters are consistent with Grade I diastolic dysfunction (impaired relaxation). Right Ventricle: The right ventricular size is normal. No increase in right ventricular wall thickness. Right ventricular systolic function is mildly  reduced. There is normal pulmonary artery systolic pressure. The tricuspid regurgitant velocity is 2.26 m/s, and with an assumed right atrial pressure of 3 mmHg, the estimated right ventricular systolic pressure is 23.4 mmHg. Left Atrium: Left atrial size was normal in size. Right Atrium: Right atrial size was normal in size. Pericardium: There is no evidence of pericardial effusion. Presence of epicardial fat layer. Mitral Valve: The mitral valve is normal in structure. No evidence of mitral valve regurgitation. No evidence of mitral valve stenosis. Tricuspid Valve: The tricuspid valve is normal in structure. Tricuspid valve regurgitation is trivial. No evidence of tricuspid stenosis. Aortic Valve: The aortic valve is normal in structure. There is mild aortic valve annular calcification. Aortic valve regurgitation is not visualized. Aortic valve sclerosis is present, with no evidence of aortic valve stenosis. Aortic valve peak gradient measures 5.0 mmHg. Pulmonic Valve: The pulmonic valve was normal in structure. Pulmonic valve regurgitation is not visualized. No evidence of pulmonic stenosis. Aorta: The aortic root is normal in size and structure. Venous: The inferior vena cava is normal in size with greater than 50% respiratory variability, suggesting right atrial pressure of 3 mmHg. IAS/Shunts: No atrial level shunt detected by color flow Doppler.  LEFT VENTRICLE PLAX 2D LVIDd:         5.00 cm      Diastology LVIDs:         4.10 cm      LV e' medial:    6.42 cm/s LV PW:         1.00 cm      LV E/e' medial:  8.1 LV IVS:        1.00 cm      LV e' lateral:   5.77 cm/s LVOT diam:     2.00 cm      LV E/e' lateral: 9.1 LV SV:         61 LV SV Index:   32 LVOT Area:     3.14 cm  LV Volumes (MOD) LV vol d, MOD A2C: 98.8 ml LV vol d, MOD A4C: 112.0 ml LV vol s, MOD A2C: 46.5 ml LV vol s, MOD A4C: 53.6 ml LV SV MOD A2C:     52.3 ml LV SV MOD A4C:     112.0 ml LV SV MOD BP:      56.6 ml RIGHT VENTRICLE            IVC RV  Basal diam:  3.00 cm    IVC diam: 1.50 cm RV Mid diam:    2.30 cm RV S prime:     7.94 cm/s TAPSE (M-mode): 1.6 cm LEFT ATRIUM             Index        RIGHT ATRIUM           Index LA diam:        3.50 cm 1.85 cm/m   RA Area:     10.70 cm LA Vol (A2C):   45.0 ml 23.74 ml/m  RA Volume:   22.60 ml  11.92 ml/m LA Vol (A4C):   31.9 ml 16.83 ml/m LA Biplane Vol: 38.4 ml 20.26 ml/m  AORTIC VALVE  AV Area (Vmax): 2.56 cm AV Vmax:        112.00 cm/s AV Peak Grad:   5.0 mmHg LVOT Vmax:      91.40 cm/s LVOT Vmean:     63.400 cm/s LVOT VTI:       0.195 m  AORTA Ao Root diam: 3.30 cm Ao Asc diam:  3.00 cm MITRAL VALVE               TRICUSPID VALVE MV Area (PHT): 2.52 cm    TR Peak grad:   20.4 mmHg MV Decel Time: 301 msec    TR Vmax:        226.00 cm/s MV E velocity: 52.30 cm/s MV A velocity: 57.40 cm/s  SHUNTS MV E/A ratio:  0.91        Systemic VTI:  0.20 m                            Systemic Diam: 2.00 cm Kardie Tobb DO Electronically signed by Thomasene Ripple DO Signature Date/Time: 11/26/2020/11:34:38 AM    Final     Cardiac Studies   Echo: IMPRESSIONS     1. Left ventricular ejection fraction, by estimation, is 50 to 55%. The  left ventricle has low normal function. The left ventricle has no regional  wall motion abnormalities. Left ventricular diastolic parameters are  consistent with Grade I diastolic  dysfunction (impaired relaxation).   2. Right ventricular systolic function is mildly reduced. The right  ventricular size is normal. There is normal pulmonary artery systolic  pressure.   3. The mitral valve is normal in structure. No evidence of mitral valve  regurgitation. No evidence of mitral stenosis.   4. The aortic valve is normal in structure. Aortic valve regurgitation is  not visualized. Aortic valve sclerosis is present, with no evidence of  aortic valve stenosis.   5. The inferior vena cava is normal in size with greater than 50%  respiratory variability, suggesting right atrial  pressure of 3 mmHg.   Comparison(s): No prior Echocardiogram  LEFT HEART CATH AND CORONARY ANGIOGRAPHY   Conclusion      Prox RCA lesion is 50% stenosed.   Prox LAD to Mid LAD lesion is 50% stenosed.   Mid LAD lesion is 50% stenosed.   1st Mrg lesion is 90% stenosed.   Prox Cx lesion is 40% stenosed.   A stent was successfully placed.   A drug-eluting stent was successfully placed using a SYNERGY XD 3.0X38.   Post intervention, there is a 0% residual stenosis.   Post intervention, there is a 0% residual stenosis.   LV end diastolic pressure is normal.   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 Moderate nonobstructive disease in the LAD and RCA Normal LVEDP Successful PCI of the LCx/OM1 with DES x 1   Plan: DAPT for one year. Aggressive risk factor modification. Anticipate DC in am.  Diagnostic Dominance: Right Intervention  Implants      Patient Profile     65 y.o. male with PMH of HLD, OSA on Cpap, hypothyroidism and tobacco use who presented with chest pain and found to have a NSTEMI.   Assessment & Plan    NSTEMI: hsTn peaked 1273. Hx of coronary calcifications on CT. He was admitted and loaded with Brilinta on 11/13. -- cardiac cath showed culprit lesion in a large OM1. Successfully stented -- moderate nonobstructive disease in LAD and RCA --  on DAPT with ASA and Brilinta for one year.  ---Toprol XL 25 mg daily -- echo EF 5-55%.  -- plan DC home today  HLD: LDL 147 -- on pravastatin in the past. Has had trouble with atorva/crestor in the past. Will plan for referral to the lipid clinic for consideration of PCSK9s -- Currently on high dose statin for near term.  Hypothyroidism: on synthroid  OSA on Cpap:  reports compliance   PreDM: Hgb A1c 5.8  Tobacco use: cessation discussed.   For questions or updates, please contact [facility] Please consult [email or web address] for contact info under        Signed, Dajahnae Vondra  [place], MD  11/27/2020, 8:47 AM

## 2020-11-27 NOTE — Discharge Instructions (Signed)

## 2020-11-27 NOTE — Progress Notes (Signed)
Reviewed d/c instructions with pt, no questions or concerns. Patient will be driving himself home because he does not have a ride. Pt is alert and oriented X4.

## 2020-11-27 NOTE — Care Management (Signed)
1135 11-27-20 Staff RN provided patient with the Brilinta discount card to provide to pharmacy.

## 2020-11-29 ENCOUNTER — Telehealth (HOSPITAL_COMMUNITY): Payer: Self-pay

## 2020-11-29 NOTE — Telephone Encounter (Signed)
Called patient to see if he is interested in the Cardiac Rehab Program. Patient expressed interest. Explained scheduling process and went over insurance, patient verbalized understanding. Will contact patient for scheduling once f/u has been completed.  °

## 2020-12-03 ENCOUNTER — Telehealth: Payer: Self-pay | Admitting: General Practice

## 2020-12-03 NOTE — Telephone Encounter (Signed)
Pt reports that he is not sure if he took one/two of his Brilinta this morning, and complains of light headedness. Confirmed with pharmD. Pt advised to monitor to be safe the rest of the day, risk is bleeding. Aware light headedness is not related at this time. Patient verbalized understanding and agreeable to plan.

## 2020-12-03 NOTE — Telephone Encounter (Signed)
Triage call: patient says that he may have overdosed on Brilinta and he also took his blood pressure medication. Patient is experiencing severe lightheadedness.

## 2020-12-03 NOTE — Telephone Encounter (Signed)
Patient contacted regarding discharge from [facility] on 11/27/20.  Patient understands to follow up with provider cleaver on 12/17/20 at 2:20 pm at [facility]. Patient understands discharge instructions? yes Patient understands medications and regiment? yes Patient understands to bring all medications to this visit? yes  Ask patient:  Are you enrolled in My Chart yes  If no ask patient if they would like to enroll.

## 2020-12-12 ENCOUNTER — Telehealth: Payer: Self-pay | Admitting: *Deleted

## 2020-12-12 DIAGNOSIS — Z006 Encounter for examination for normal comparison and control in clinical research program: Secondary | ICD-10-CM

## 2020-12-12 NOTE — Telephone Encounter (Signed)
Spoke with patient about participating in Clinical Research Study [facility].  Patient was emailed a copy of consent.  Will Follow-up on 12/5.   Evern Bio, RN BSN [facility] Cardiovascular Research & Education Direct Line: [phone]

## 2020-12-13 ENCOUNTER — Other Ambulatory Visit (HOSPITAL_COMMUNITY): Payer: Self-pay

## 2020-12-13 ENCOUNTER — Telehealth (HOSPITAL_COMMUNITY): Payer: Self-pay

## 2020-12-13 NOTE — Telephone Encounter (Signed)
Transitions of Care Pharmacy   Call attempted for a pharmacy transitions of care follow-up. Unable to leave voicemail.  Call attempt #1. Will follow-up in 2-3 days.    

## 2020-12-14 ENCOUNTER — Telehealth (HOSPITAL_COMMUNITY): Payer: Self-pay

## 2020-12-14 NOTE — Telephone Encounter (Signed)
Transitions of Care Pharmacy   Call attempted for a pharmacy transitions of care follow-up. Unable to leave voicemail.   Call attempt #2. Will follow-up in 2-3 days.    

## 2020-12-16 NOTE — Progress Notes (Signed)
Cardiology Office Note:    Date:  12/16/2020   ID:  John Randolph, DOB 12/23/1955, MRN [id number]  PCP:  Mayra Neer, MD   [facility] Providers Cardiologist:  Candee Furbish, MD     Referring MD: Mayra Neer, MD   Follow-up status post NSTEMI  History of Present Illness:    John Randolph is a 65 y.o. male with a hx of coronary artery disease status post NSTEMI, obstructive sleep apnea, hyperlipidemia, OSA on CPAP, hypothyroidism, and tobacco use.  He has a known history of hyperlipidemia and takes pravastatin.  He previously did not tolerate atorvastatin or rosuvastatin due to muscle aches.  He recently underwent chest CT for lung cancer screening.  It did note coronary calcifications in the distribution of LAD and circumflex.  He smokes 2 packs/day.  He reported that he did want to quit smoking.  He was admitted to the hospital on 11/25/2020 and discharged on 11/27/2020.  He reported that over the last 6 months he had been getting a chest burning type sensation after eating dinner.  The symptoms would resolve in 10 minutes after taking Tums.  He denied any indigestion type feeling with increased physical activity.  His symptoms always resolved with Tums.  In the evening he started to develop burning chest discomfort.  It was more intense than previous episodes.  He also noted left arm discomfort.  He took Tums and his symptoms did not resolve.  He denied shortness of breath, nausea, and diaphoresis.  He called 911 and was taken to the emergency department.  Upon arrival to the ED he was hemodynamically stable.  His EKG showed diffuse ST depressions.  His troponins were elevated over thousand.  He continued to have chest discomfort in the emergency department which was largely resolved by sublingual nitroglycerin.  He was started on nitroglycerin gtt. which resolved his chest discomfort.  He was taken to the cardiac Cath Lab which showed proximal RCA lesion 50%, proximal  LAD-mid LAD 50%, mid LAD 50%, first marginal 90%, proximal circumflex 40%.  He received PCI with DES x1 to his proximal OM1.  He was placed on dual antiplatelet therapy and metoprolol.  His pravastatin was transitioned to atorvastatin and referral was placed for lipid clinic.  He presents to the clinic today for follow-up evaluation states he feels well.  He is slowly increasing his physical activity and played 18 holes of golf last week.  He did notice that 15 holes he was somewhat fatigued.  He does not feel his energy level is quite returned to normal.  We reviewed his angiography and he expressed understanding.  We also reviewed the importance of heart healthy low-sodium high-fiber diet.  He reports that he enjoys a meat and potatoes type diet.  He is tolerating his medications well and denies side effects from I will repeat his fasting lipids and LFTs in 4 to 6 weeks, continue his current medication regimen, plan follow-up in 3 to 4 months.  Today he denies chest pain, shortness of breath, lower extremity edema, fatigue, palpitations, melena, hematuria, hemoptysis, diaphoresis, weakness, presyncope, syncope, orthopnea, and PND.    Past Medical History:  Diagnosis Date   Hyperlipidemia    Hypertension    Hypothyroidism    Sleep apnea    DX years ago but never used CPAP   Thyroid disease    hypothyroidism    Past Surgical History:  Procedure Laterality Date   LAPAROSCOPIC INGUINAL HERNIA WITH UMBILICAL HERNIA N/A [id number]  Procedure: LAPAROSCOPIC BILATERAL  INGUINAL HERNIA REPAIR WITH MESH AND OPEN UMBILICAL HERNIA REPAIR;  Surgeon: Kinsinger, Arta Bruce, MD;  Location: [facility];  Service: General;  Laterality: N/A;   LEFT HEART CATH AND CORONARY ANGIOGRAPHY N/A 11/26/2020   Procedure: LEFT HEART CATH AND CORONARY ANGIOGRAPHY;  Surgeon: Martinique, Peter M, MD;  Location: [place] [facility];  Service: Cardiovascular;  Laterality: N/A;   THYROIDECTOMY      Current Medications: No  outpatient medications have been marked as taking for the 12/17/20 encounter (Appointment) with Deberah Pelton, NP.     Allergies:   Crestor [rosuvastatin], Hydrochlorothiazide, Lipitor [atorvastatin], Other, and Zocor [simvastatin]   Social History   Socioeconomic History   Marital status: Widowed    Spouse name: Not on file   Number of children: Not on file   Years of education: Not on file   Highest education level: Not on file  Occupational History   Not on file  Tobacco Use   Smoking status: Every Day    Packs/day: 1.50    Years: 30.00    Pack years: 45.00    Types: Cigarettes   Smokeless tobacco: Never  Vaping Use   Vaping Use: Never used  Substance and Sexual Activity   Alcohol use: Yes    Comment: occassionally   Drug use: No   Sexual activity: Not on file  Other Topics Concern   Not on file  Social History Narrative   Not on file   Social Determinants of Health   Financial Resource Strain: Not on file  Food Insecurity: Not on file  Transportation Needs: Not on file  Physical Activity: Not on file  Stress: Not on file  Social Connections: Not on file     Family History: The patient's family history is not on file.  ROS:   Please see the history of present illness.     All other systems reviewed and are negative.   Risk Assessment/Calculations:           Physical Exam:    VS:  There were no vitals taken for this visit.    Wt Readings from Last 3 Encounters:  11/25/20 171 lb 11.2 oz (77.9 kg)  01/04/20 171 lb 3.2 oz (77.7 kg)  03/16/17 171 lb (77.6 kg)     GEN:  Well nourished, well developed in no acute distress HEENT: Normal NECK: No JVD; No carotid bruits LYMPHATICS: No lymphadenopathy CARDIAC: RRR, no murmurs, rubs, gallops RESPIRATORY:  Clear to auscultation without rales, wheezing or rhonchi  ABDOMEN: Soft, non-tender, non-distended MUSCULOSKELETAL:  No edema; No deformity  SKIN: Warm and dry.  Right radial cath site clean dry  intact no signs of infection NEUROLOGIC:  Alert and oriented x 3 PSYCHIATRIC:  Normal affect    EKGs/Labs/Other Studies Reviewed:    The following studies were reviewed today: Echocardiogram 11/26/2020 IMPRESSIONS     1. Left ventricular ejection fraction, by estimation, is 50 to 55%. The  left ventricle has low normal function. The left ventricle has no regional  wall motion abnormalities. Left ventricular diastolic parameters are  consistent with Grade I diastolic  dysfunction (impaired relaxation).   2. Right ventricular systolic function is mildly reduced. The right  ventricular size is normal. There is normal pulmonary artery systolic  pressure.   3. The mitral valve is normal in structure. No evidence of mitral valve  regurgitation. No evidence of mitral stenosis.   4. The aortic valve is normal in structure. Aortic valve regurgitation  is  not visualized. Aortic valve sclerosis is present, with no evidence of  aortic valve stenosis.   5. The inferior vena cava is normal in size with greater than 50%  respiratory variability, suggesting right atrial pressure of 3 mmHg.   Comparison(s): No prior Echocardiogram.    Cardiac catheterization 11/26/2020   Prox RCA lesion is 50% stenosed.   Prox LAD to Mid LAD lesion is 50% stenosed.   Mid LAD lesion is 50% stenosed.   1st Mrg lesion is 90% stenosed.   Prox Cx lesion is 40% stenosed.   A stent was successfully placed.   A drug-eluting stent was successfully placed using a SYNERGY [id number] [id number].   Post intervention, there is a 0% residual stenosis.   Post intervention, there is a 0% residual stenosis.   LV end diastolic pressure is normal.   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 Moderate nonobstructive disease in the LAD and RCA Normal LVEDP Successful PCI of the LCx/OM1 with DES x 1   Plan: DAPT for one year. Aggressive risk factor modification. Anticipate DC in am. \ Diagnostic Dominance:  Right Intervention    EKG:  EKG is  ordered today.  The ekg ordered today demonstrates normal sinus rhythm incomplete right bundle branch block 64 bpm  Recent Labs: 11/24/2020: ALT 16 11/25/2020: B Natriuretic Peptide 76.3; TSH 1.676 11/27/2020: BUN 10; Creatinine, Ser 0.93; Hemoglobin 14.9; Magnesium 1.9; Platelets 225; Potassium 3.7; Sodium 133  Recent Lipid Panel    Component Value Date/Time   CHOL 234 (H) 11/24/2020 2352   TRIG 247 (H) 11/24/2020 2352   HDL 38 (L) 11/24/2020 2352   CHOLHDL 6.2 11/24/2020 2352   VLDL 49 (H) 11/24/2020 2352   LDLCALC 147 (H) 11/24/2020 2352    ASSESSMENT & PLAN    NSTEMI-denies further episodes of chest discomfort and indigestion.  Underwent cardiac catheterization on 11/26/2020 received PCI with DES x1 to his OM1 Continue atorvastatin, aspirin, metoprolol, Brilinta, nitroglycerin as needed Heart healthy low-sodium diet-salty 6 given Increase physical activity as tolerated  Hyperlipidemia-11/24/2020: Cholesterol 234; HDL 38; LDL Cholesterol 147; Triglycerides 247; VLDL 49 Continue aspirin, atorvastatin Heart healthy low-sodium high-fiber diet Increase physical activity as tolerated Follow-up with lipid clinic Repeat fasting lipids and LFTs in 4 to 6 weeks.  OSA-reports intermittent compliance with CPAP.  Does not feel he notices much difference between not wearing his CPAP and when he wears his CPAP.  Reviewed importance of compliance. Continue CPAP use  Hypothyroidism-reports compliance with Synthroid therapy. Follows with PCP  Tobacco use-less that a pk per day.  Reports he continues to try to cut back his smoking. Feels he is breathing better Smoking cessation strongly recommended  Disposition: Follow-up with Dr. Anne Fu  in 3-4 months.        Medication Adjustments/Labs and Tests Ordered: Current medicines are reviewed at length with the patient today.  Concerns regarding medicines are outlined above.  No Randolph of the  defined types were placed in this encounter.  No Randolph of the defined types were placed in this encounter.   There are no Patient Instructions on file for this visit.   Signed, Ronney Asters, NP  12/16/2020 12:51 PM      Notice: This dictation was prepared with Dragon dictation along with smaller phrase technology. Any transcriptional errors that result from this process are unintentional and may not be corrected upon review.  I spent 13 minutes examining this patient, reviewing medications, and using patient centered  shared decision making involving her cardiac care.  Prior to her visit I spent greater than 20 minutes reviewing her past medical history,  medications, and prior cardiac tests.

## 2020-12-17 ENCOUNTER — Ambulatory Visit (HOSPITAL_BASED_OUTPATIENT_CLINIC_OR_DEPARTMENT_OTHER): Payer: BC Managed Care – PPO | Admitting: General Practice

## 2020-12-17 ENCOUNTER — Encounter (HOSPITAL_BASED_OUTPATIENT_CLINIC_OR_DEPARTMENT_OTHER): Payer: Self-pay | Admitting: General Practice

## 2020-12-17 ENCOUNTER — Other Ambulatory Visit: Payer: Self-pay

## 2020-12-17 ENCOUNTER — Telehealth (HOSPITAL_COMMUNITY): Payer: Self-pay

## 2020-12-17 ENCOUNTER — Other Ambulatory Visit (HOSPITAL_COMMUNITY): Payer: Self-pay

## 2020-12-17 VITALS — BP 124/74 | HR 64 | Ht 67.0 in | Wt 169.0 lb

## 2020-12-17 DIAGNOSIS — E038 Other specified hypothyroidism: Secondary | ICD-10-CM | POA: Diagnosis not present

## 2020-12-17 DIAGNOSIS — I214 Non-ST elevation (NSTEMI) myocardial infarction: Secondary | ICD-10-CM

## 2020-12-17 DIAGNOSIS — G4733 Obstructive sleep apnea (adult) (pediatric): Secondary | ICD-10-CM | POA: Diagnosis not present

## 2020-12-17 DIAGNOSIS — Z720 Tobacco use: Secondary | ICD-10-CM

## 2020-12-17 DIAGNOSIS — E782 Mixed hyperlipidemia: Secondary | ICD-10-CM | POA: Diagnosis not present

## 2020-12-17 NOTE — Telephone Encounter (Signed)
Pharmacy Transitions of Care Follow-up Telephone Call  Date of discharge: 11/27/20  Discharge Diagnosis: NSTEMI  How have you been since you were released from the hospital?  Patient doing weel since discharge, no questions about meds at this time  Medication changes made at discharge:     START taking: Aspirin Low Dose (aspirin)  atorvastatin (LIPITOR)  Brilinta (ticagrelor)  metoprolol succinate (TOPROL-XL)  nicotine polacrilex (NICORETTE)            CHANGE how you take: tadalafil (CIALIS)  STOP taking: pravastatin 80 MG tablet (PRAVACHOL)   Medication changes verified by the patient? Yes    Medication Accessibility:  Home Pharmacy:  [facility] [address]  Was the patient provided with refills on discharged medications? Yes   Have all prescriptions been transferred from [facility] to home pharmacy?  Yes  Is the patient able to afford medications? Has insurance    Medication Review:  TICAGRELOR (BRILINTA) Ticagrelor 90 mg BID initiated on 11/27/20.  - Educated patient on expected duration of therapy of aspirin with ticagrelor.  - Discussed importance of taking medication around the same time every day, - Advised patient of medications to avoid (NSAIDs, aspirin maintenance doses>100 mg daily) - Educated that Tylenol (acetaminophen) will be the preferred analgesic to prevent risk of bleeding  - Emphasized importance of monitoring for signs and symptoms of bleeding (abnormal bruising, prolonged bleeding, nose bleeds, bleeding from gums, discolored urine, black tarry stools)  - Educated patient to notify doctor if shortness of breath or abnormal heartbeat occur - Advised patient to alert all providers of antiplatelet therapy prior to starting a new medication or having a procedure   Follow-up Appointments:  PCP Hospital f/u appt confirmed? None currently scheduled  Specialist Hospital f/u appt confirmed? Scheduled to see Dr. Molli Hazard on 12/17/20 @ 2:20pm.   If their condition  worsens, is the pt aware to call PCP or go to the Emergency Dept.? Yes  Final Patient Assessment: Patient has f/u scheduled and refills at home pharmacy

## 2020-12-17 NOTE — Patient Instructions (Signed)
Medication Instructions:  Your Physician recommend you continue on your current medication as directed.    *If you need a refill on your cardiac medications before your next appointment, please call your pharmacy*   Lab Work: Please return for Lab work in 4-6 weeks for a fasting Lipid Panel and Liver Function Tests. You may come to the...   Therapist, music (3rd floor) [address] [place], [place] [address]   [facility] [address]- Any location  If you have labs (blood work) drawn today and your tests are completely normal, you will receive your results only by: [employer] (if you have MyChart) OR A paper copy in the mail If you have any lab test that is abnormal or we need to change your treatment, we will call you to review the results.   Testing/Procedures: None ordered today    Follow-Up: At [facility], you and your health needs are our priority.  As part of our continuing mission to provide you with exceptional heart care, we have created designated Provider Care Teams.  These Care Teams include your primary Cardiologist (physician) and Advanced Practice Providers (APPs -  Physician Assistants and Nurse Practitioners) who all work together to provide you with the care you need, when you need it.  We recommend signing up for the patient portal called "MyChart".  Sign up information is provided on this After Visit Summary.  MyChart is used to connect with patients for Virtual Visits (Telemedicine).  Patients are able to view lab/test results, encounter notes, upcoming appointments, etc.  Non-urgent messages can be sent to your provider as well.   To learn more about what you can do with MyChart, go to [email or web address].    Your next appointment:   3-4 month(s)  The format for your next appointment:   In Person  Provider:   Dr. Anne Fu      Other Instructions      American Heart  Association Recommendations for Physical Activity in Adults  Are you fitting in at least 150 minutes (2.5 hours) of heart-pumping physical activity per week? If not, you're not alone. Only about one in five adults and teens get enough exercise to maintain good health. Being more active can help all people think, feel and sleep better and perform daily tasks more easily. And if you're sedentary, sitting less is a great place to start.  These recommendations are based on the Physical Activity Guidelines for Americans, 2nd edition, published by the U.S. Department of Health and Health and safety inspector, Office of Disease Prevention and Health Promotion. They recommend how much physical activity we need to be healthy. The guidelines are based on current scientific evidence supporting the connections between physical activity, overall health and well-being, disease prevention and quality of life.  AHA Physical Activity Recommendations for Adults     Get at least 150 minutes per week of moderate-intensity aerobic activity or 75 minutes per week of vigorous aerobic activity, or a combination of both, preferably spread throughout the week.  Add moderate- to high-intensity muscle-strengthening activity (such as resistance or weights) on at least 2 days per week. Spend less time sitting. Even light-intensity activity can offset some of the risks of being sedentary.  Gain even more benefits by being active at least 300 minutes (5 hours) per week. Increase amount and intensity gradually over time.   What is intensity?  Physical activity is anything that moves your body and burns calories.  This includes things like walking, climbing stairs and stretching.  Aerobic (or "cardio") activity gets your heart rate up and benefits your heart by improving cardiorespiratory fitness. When done at moderate intensity, your heart will beat faster and you'll breathe harder than normal, but you'll still be able to talk. Think of  it as a medium or moderate amount of effort.  Examples of moderate-intensity aerobic activities:  brisk walking (at least 2.5 miles per hour) water aerobics dancing (ballroom or social) gardening tennis (doubles) biking slower than 10 miles per hour Vigorous intensity activities will push your body a little further. They will require a higher amount of effort. You'll probably get warm and begin to sweat. You won't be able to talk much without getting out of breath.  Examples of vigorous-intensity aerobic activities:  hiking uphill or with a heavy backpack running swimming laps aerobic dancing heavy yardwork like continuous digging or hoeing tennis (singles) cycling 10 miles per hour or faster jumping rope Knowing your target heart rate can also help you track the intensity of your activities.  For maximum benefits, include both moderate- and vigorous-intensity activity in your routine along with strengthening and stretching exercises.  What if I'm just starting to get active?  Don't worry if you can't reach 150 minutes per week just yet. Everyone has to start somewhere. Even if you've been sedentary for years, today is the day you can begin to make healthy changes in your life. Set a reachable goal for today. You can work up toward the recommended amount by increasing your time as you get stronger. Don't let all-or-nothing thinking keep you from doing what you can every day.  The simplest way to get moving and improve your health is to start walking. It's free, easy and can be done just about anywhere, even in place.  Any amount of movement is better than none. And you can break it up into short bouts of activity throughout the day. Taking a brisk walk for five or ten minutes a few times a day will add up.  If you have a chronic condition or disability, talk with your healthcare provider about what types and amounts of physical activity are right for you before making too many  changes. But don't wait! Get started today by simply sitting less and moving more, whatever that looks like for you.  The takeaway:  Move more, with more intensity, and sit less. Science has linked being inactive and sitting too much with higher risk of heart disease, type 2 diabetes, colon and lung cancers, and early death.  It's clear that being more active benefits everyone and helps [place] live longer, healthier lives.  Here are some of the big wins:  Lower risk of heart disease, stroke, type 2 diabetes, high blood pressure, dementia and Alzheimer's, several types of cancer, and some complications of pregnancy\  Better sleep, including improvements in insomnia and obstructive sleep apnea  Improved cognition, including memory, attention and processing speed  Less weight gain, obesity and related chronic health conditions  Better bone health and balance, with less risk of injury from falls  Fewer symptoms of depression and anxiety  Better quality of life and sense of overall well-being   Fiber Content in Foods Fiber is a substance that is found in plant foods, such as fruits, vegetables, whole grains, nuts, seeds, and beans. As part of your treatment and recovery plan, your health care provider may recommend that you eat foods that have specific amounts of dietary  fiber. Some conditions may require a high-fiber diet while others may require a low-fiber diet. This sheet gives you information about the dietary fiber content of some common foods. Your health care provider will tell you how much fiber you need in your diet. If you have problems or questions, contact your health care provider or dietitian. What foods are high in fiber? Fruits Blackberries or raspberries (fresh) --  cup (75 g) has 4 g of fiber. Pear (fresh) -- 1 medium (180 g) has 5.5 g of fiber. Prunes (dried) -- 6 to 8 pieces (57-76 g) has 5 g of fiber. Apple with skin -- 1 medium (182 g) has 4.8 g of fiber. Guava -- 1  cup (128 g) has 8.9 g of fiber. Vegetables Peas (frozen) --  cup (80 g) has 4.4 g of fiber. Potato with skin (baked) -- 1 medium (173 g) has 4.4 g of fiber. Pumpkin (canned) --  cup (122 g) has 5 g of fiber. Brussels sprouts (cooked) --  cup (78 g) has 4 g of fiber. Sweet potato --  cup mashed (124 g) has 4 g of fiber. Winter squash -- 1 cup cooked (205 g) has 5.7 g of fiber. Grains Bran cereal --  cup (31 g) has 8.6 g of fiber. Bulgur (cooked) --  cup (70 g) has 4 g of fiber. Quinoa (cooked) -- 1 cup (185 g) has 5.2 g of fiber. Popcorn -- 3 cups (375 g) popped has 5.8 g of fiber. Spaghetti, whole wheat -- 1 cup (140 g) has 6 g of fiber. Meats and other proteins Pinto beans (cooked) --  cup (90 g) has 7.7 g of fiber. Lentils (cooked) --  cup (90 g) has 7.8 g of fiber. Kidney beans (canned) --  cup (92.5 g) has 5.7 g of fiber. Soybeans (canned, frozen, or fresh) --  cup (92.5 g) has 5.2 g of fiber. Baked beans, plain or vegetarian (canned) --  cup (130 g) has 5.2 g of fiber. Garbanzo beans or chickpeas (canned) --  cup (90 g) has 6.6 g of fiber. Black beans (cooked) --  cup (86 g) has 7.5 g of fiber. White beans or navy beans (cooked) --  cup (91 g) has 9.3 g of fiber. The items listed above may not be a complete list of foods with high fiber. Actual amounts of fiber may be different depending on processing. Contact a dietitian for more information. What foods are moderate in fiber? Fruits Banana -- 1 medium (126 g) has 3.2 g of fiber. Melon -- 1 cup (155 g) has 1.4 g of fiber. Orange -- 1 small (154 g) has 3.7 g of fiber. Raisins --  cup (40 g) has 1.8 g of fiber. Applesauce, sweetened --  cup (125 g) has 1.5 g of fiber. Blueberries (fresh) --  cup (75 g) has 1.8 g of fiber. Strawberries (fresh, sliced) -- 1 cup (150 g) has 3 g of fiber. Cherries -- 1 cup (140 g) has 2.9 g of fiber. Vegetables Broccoli (cooked) --  cup (77.5 g) has 2.1 g of fiber. Carrots  (cooked) --  cup (77.5 g) has 2.2 g of fiber. Corn (canned or frozen) --  cup (82.5 g) has 2.1 g of fiber. Potatoes, mashed --  cup (105 g) has 1.6 g of fiber. Tomato -- 1 medium (62 g) has 1.5 g of fiber. Green beans (canned) --  cup (83 g) has 2 g of fiber. Squash, winter --  cup (58 g) has  1 g of fiber. Sweet potato, baked -- 1 medium (150 g) has 3 g of fiber. Cauliflower (cooked) -- 1/2 cup (90 g) has 2.3 g of fiber. Grains Long-grain brown rice (cooked) -- 1 cup (196 g) has 3.5 g of fiber. Bagel, plain -- one 4-inch (10 cm) bagel has 2 g of fiber. Instant oatmeal --  cup (120 g) has about 2 g of fiber. Macaroni noodles, enriched (cooked) -- 1 cup (140 g) has 2.5 g of fiber. Multigrain cereal --  cup (15 g) has about 2-4 g of fiber. Whole-wheat bread -- 1 slice (26 g) has 2 g of fiber. Whole-wheat spaghetti noodles --  cup (70 g) has 3.2 g of fiber. Corn tortilla -- one 6-inch (15 cm) tortilla has 1.5 g of fiber. Meats and other proteins Almonds --  cup or 1 oz (28 g) has 3.5 g of fiber. Sunflower seeds in shell --  cup or  oz (11.5 g) has 1.1 g of fiber. Vegetable or soy patty -- 1 patty (70 g) has 3.4 g of fiber. Walnuts --  cup or 1 oz (30 g) has 2 g of fiber. Flax seed -- 1 Tbsp (7 g) has 2.8 g of fiber. The items listed above may not be a complete list of foods that have moderate amounts of fiber. Actual amounts of fiber may be different depending on processing. Contact a dietitian for more information. What foods are low in fiber? Low-fiber foods contain less than 1 g of fiber per serving. They include: Fruits Fruit juice --  cup or 4 fl oz (118 mL) has 0.5 g of fiber. Vegetables Lettuce -- 1 cup (35 g) has 0.5 g of fiber. Cucumber (slices) --  cup (60 g) has 0.3 g of fiber. Celery -- 1 stalk (40 g) has 0.1 g of fiber. Grains Flour tortilla -- one 6-inch (15 cm) tortilla has 0.5 g of fiber. White rice (cooked) --  cup (81.5 g) has 0.3 g of fiber. Meats and  other proteins Egg -- 1 large (50 g) has 0 g of fiber. Meat, poultry, or fish -- 3 oz (85 g) has 0 g of fiber. Dairy Milk -- 1 cup or 8 fl oz (237 mL) has 0 g of fiber. Yogurt -- 1 cup (245 g) has 0 g of fiber. The items listed above may not be a complete list of foods that are low in fiber. Actual amounts of fiber may be different depending on processing. Contact a dietitian for more information. Summary Fiber is a substance that is found in plant foods, such as fruits, vegetables, whole grains, nuts, seeds, and beans. As part of your treatment and recovery plan, your health care provider may recommend that you eat foods that have specific amounts of dietary fiber. This information is not intended to replace advice given to you by your health care provider. Make sure you discuss any questions you have with your health care provider. Document Revised: 05/05/2019 Document Reviewed: 05/05/2019 Elsevier Patient Education  2022 [employer].

## 2020-12-19 ENCOUNTER — Ambulatory Visit (INDEPENDENT_AMBULATORY_CARE_PROVIDER_SITE_OTHER): Payer: Self-pay | Admitting: Pharmacist

## 2020-12-19 ENCOUNTER — Other Ambulatory Visit: Payer: Self-pay

## 2020-12-19 ENCOUNTER — Encounter: Payer: BC Managed Care – PPO | Admitting: *Deleted

## 2020-12-19 VITALS — BP 119/68 | HR 77 | Temp 97.6°F | Resp 16 | Ht 68.0 in | Wt 169.0 lb

## 2020-12-19 DIAGNOSIS — Z006 Encounter for examination for normal comparison and control in clinical research program: Secondary | ICD-10-CM

## 2020-12-19 DIAGNOSIS — E782 Mixed hyperlipidemia: Secondary | ICD-10-CM

## 2020-12-19 NOTE — Research (Signed)
Screening Visit 1  V-Inception Informed Consent   Subject Name: John Randolph Subject met inclusion and exclusion criteria.  The informed consent form, study requirements and expectations were reviewed with the subject and questions and concerns were addressed prior to the signing of the consent form.  The subject verbalized understanding of the trial requirements.  The subject agreed to participate in the V-Inception trial and signed the informed consent at 1045 on 07/DEC/2022.  The informed consent was obtained prior to performance of any protocol-specific procedures for the subject.  A copy of the signed informed consent was given to the subject and a copy was placed in the subject's medical record.   Jasmine Pang, RN BSN [facility] Direct Line: [phone]   Did this Visit Occur? '[x]'   Yes   OR     '[]'  NO  Date of Visit Date _07__/_DEC___/__2022___         DD/MON/YYYY  Type of Visit '[x]' Planned or '[]'  Unplanned '[x]'  Visit 1 Screening '[]'  Visit 2 Baseline '[]'  Visit 3 '[]'  Visit 4 '[]'  Visit 5/EOS   Protocol Version number under which subject entered study Protocol Version ___03_______  Was Study Informed Consent obtained '[x]'   Yes   OR     '[]'  NO  Date of Study Informed Consent Date _07__/_DEC___/_2022____           DD/MON/YYYY   Subject Re-Screening (collected Visit 1 only) Is the Subject being re-screened? '[]'   Yes   OR     '[x]'  NO  Site ID (where subject was previously screened) _______  Subject Number (previously screened) ______    V-Inception Inclusion Criteria Screening Yes '[x]'    No'[]'  Males and females ? 63 years of age   Yes '[x]'  No'[]'  Recent ACS (inpatient/outpatient) within 5 weeks of screening, defined as: Ischemic symptoms with unstable pattern, occurring at rest or minimal exertion within 24 hours of an unscheduled hospital admission or emergency room visit, due to presumed or proven obstructive coronary disease and at  least one of the following:  Elevated cardiac biomarkers (Cardiac Troponin (cTn) or the MB fraction of Creatinine Kinase (CKMB)) with at least one value above the 99th percentile of the upper reference limit (URL) or defined by the local laboratory MI diagnosis cut-off values OR Resting ECG changes consistent with ischemia or infarction AND additional evidence of obstructive coronary disease.   Yes '[x]'  No'[]'  Serum LDL-C ? 32m/dL or non-HDL ? 100 mg/dL   Yes '[x]'  No'[]'  Fasting triglycerides <4.52 mmol/L (<400 at screening)   Yes '[x]'  No'[]'  Calculated glomerular filtration rate >20 mL/min by estimated glomerular filtration (eGFR)   Yes '[x]'  No'[]'  Participants willing to give consent before initiation of any study related procedures and willing to comply with all required study procedures   Yes '[x]'  No'[]'  Participants are required to be on statin therapy, or have documented statin intolerance, as determined by the investigator, following hospitalization (inpatient/outpatient) for an ACS. Statin intolerant patients are eligible if they had intolerable side effects on at least 2 different statins, including one at the lowest standard dose.     Exclusion Criteria Screening Yes '[]'  No'[x]'  Any uncontrolled or serious disease, or any medical or surgical condition, that may either interfere with participation in the clinical study, and/or put the participant at significant risk (according to investigator's [or delegate] judgment) if he/she participates in the clinical study   Yes '[]'  No'[x]'  An underlying known disease, or surgical, physical, or medical condition that, in the opinion  of the investigator (or delegate) might interfere with interpretation of the clinical study results.   Yes '[]'  No'[x]'  New York Heart Association (NYHA) class IIIb or IV heart failure or last known left ventricular ejection fraction <25%. Significant cardiac arrhythmia within 3 months prior to randomization that is not controlled by medication  or via ablation at the time of screening.   Yes '[]'  No'[x]'  Uncontrolled severe hypertension: systolic blood pressure >370 mmHg or diastolic blood pressure >964 mmHg prior to randomization (assessed at screening visit) despite antihypertensive therapy   Yes '[]'  No'[x]'  Recurrent acute coronary syndrome event within 2 weeks prior to randomization   Yes '[]'  No'[x]'  Coronary angiography and revascularization procedure (PCI or CABG surgery) performed within 2 weeks prior to the randomization visit or planned after randomization   Yes '[]'  No'[x]'  Severe concomitant non-cardiovascular disease that carries the risk of reducing life expectancy to less than 2 years   Yes '[]'  No'[x]'  History of malignancy that required surgery (excluding local and wide-local excision), radiation therapy and/or systemic therapy during the three years prior to randomization.  Yes '[]'  No'[x]'  Women of child-bearing potential, defined as all women physiologically capable of becoming pregnant, unless they are using basic methods of contraception during dosing of investigational drug   Method of Choice: ______________________________  Total abstinence (when this is in line with the preferred and usual lifestyle of the participant). Periodic abstinence (e.g., calendar, ovulation, symptothermal, post-ovulation methods) and withdrawal are not acceptable methods of contraception  Male sterilization (have had surgical bilateral oophorectomy with or          without hysterectomy), total hysterectomy or tubal ligation at least six weeks before taking investigational drug. In case of oophorectomy alone, only when the reproductive status of the woman has been confirmed by follow up hormone level assessment  Male sterilization (at least 6 months prior to screening). For male participants on the study, the vasectomized male partner should be the sole partner for that subject  Barrier methods of contraception: Condom or Occlusive cap (diaphragm or  cervical/vault caps)  Use of oral (estrogen and progesterone), injected or implanted hormonal methods of contraception or other forms of hormonal contraception that have comparable efficacy (failure rate <1%), for example hormone vaginal ring or transdermal hormone contraception or placement of an intrauterine device (IUD) or intrauterine system (IUS)  In case of use of oral contraception women should have been stable on the same pill for a minimum of 3 months before taking investigational drug. Women are considered post-menopausal and not of child bearing potential if they have had 12 months of natural (spontaneous) amenorrhea with an appropriate clinical profile (e.g., age appropriate, history of vasomotor symptoms) or have had surgical bilateral oophorectomy (with or without hysterectomy), total hysterectomy or tubal ligation at least six weeks ago. In  the case of oophorectomy alone, only when the reproductive status of the woman has been confirmed by follow up hormone level assessment is she considered not of child bearing potential.    Yes '[]'  No'[x]'  Treatment with other investigational products or devices within 30 days or five half?live of the screening visit, whichever is longer.   Yes '[]'  No'[x]'  History of hypersensitivity to any of the study treatments or its excipients or to drugs of similar chemical classes.   Yes '[]'  No'[x]'  Planned use of other investigational products or devices during the course of the study   Yes '[]'  No'[x]'  Any condition that according to the investigator could interfere with the conduct of the  study, such  as but not limited to: a. Participants who are unable to communicate or to cooperate with the  investigator. b. Unable to understand the protocol requirements, instructions and study-related  restrictions, the nature, scope, and possible consequences of the study  (including participants whose cooperation is doubtful due to drug abuse or  alcohol dependency). c.  Unlikely to comply with the protocol requirements, instructions, and study-related restrictions (e.g., uncooperative attitude, inability to return for follow-up visits, and improbability of completing the study). d. Have any medical or surgical condition, which in the opinion of the  investigator would put the participant at increased risk from participating in the  study. e. Persons directly involved in the conduct of the study   Yes '[]'  No'[x]'  Treatment with monoclonal antibodies directed towards PCSK9 within 90 days of screening.   Yes '[]'  No'[x]'  Active liver disease defined as any known current infectious, neoplastic, or metabolic pathology of the liver or (ii) alanine aminotransferase (ALT) elevation >3x ULN, aspartate aminotransferase (AST) elevation >3x ULN, or total bilirubin elevation >2x ULN (except patients with Gilbert's syndrome) at screening confirmed by a repeat measurement at least one week apart.     Subject Age ___65____ Years  Subject Sex '[x]' Male  OR  '[]'  Male  Subject Childbearing Potential '[]'   Yes   OR     '[x]'  NO  Ethnicity '[]'  Hispanic or Latino  '[x]'  Not Hispanic or Latino '[]'  Not Reported '[]'  Unknown  Race '[x]' White '[]' Black or African American '[]' Asian '[]'  American [place] or [place] Native '[]'  Native Hawaiian or Other [place] '[]'  Unknown  Source or Subject Referral '[]' Advocacy Group '[]' Clinical Trial Registry '[x]'  ER Visit or Hospital '[]'  Non-[employer] Internet Sites '[]'  Newsletter/Educational '[]'  Material '[]' [employer] Internet Site '[]'  13 Own Practice '[]' Print Advertisement '[]'  Physician Referral '[]'  Radio Advertisement '[]'  Television Advertisement '[]'  Other '[]'  Unknown   Acute Coronary Syndrome (ACS) Medical History  Date of Discharge Hospitalization for Index ACS Event _15___/_DEC_/_2022_ DD/MMM/YYYY  Does Subject have a history of elevated cardiac biomarkers (cardiac troponin cTn) or MB fraction of creatinine kinase (CKMB) with at least one value  above the 99th percentile of the upper reference limit (URL) or defined by the local laboratory MI diagnosis cut off values   '[x]'   Yes   OR     '[]'  NO  Does the Subject have history of resting ECG changes consistent with ischemia or infarction AND additional evidence of obstructive coronary disease   '[x]'   Yes   OR     '[]'  NO  Type of Imagining performed '[]'  MRI '[]' CT Scan '[x]'  Cardiac Cath '[]'  Other___________ ____________________   Index ACS Event Classification Note: Any responses that are Yes in this section, do not need to be entered in the medical History other CRF  Did the Subject have a MI '[x]'   Yes   OR     '[]'  NO '[]'  STEMI   '[x]'  NSTEMI '[]'  Unstable Angina PCI during Event?  '[x]'   Yes   OR     '[]'  NO Systolic pressure during Index Therapist, occupational Diastolic Pressure during Index MI __103___ Atrial Fibrillation during Index MI  '[]' Yes'[x]'  NO   Unstable Angina '[]'   Yes   OR     '[x]'  NO Location_______________________________________________________ ________________________________________________________________  ST segment elevation myocardial infarction (STEMI) '[]'   Yes   OR     '[x]'  NO Location________________________________________________________ ________________________________________________________________  Non-ST-segment elevation myocardial infarction (NSTEMI) '[x]'   Yes   OR     '[]'  NO Location___Anterior___________  Revascularization Procedure Associated with ACS Event   Did the subject undergo a revascularization procedure during the Index ACS Event '[x]'   Yes   OR   '[]'  NO   Type of revascularization procedure  PCI  Date of PCI with Stent Placement procedure _14___/_NOV_/_2022__ DD/MMM/YYYY  Date of PCI without Stent Placement procedure ____/_____/_______ DD/MMM/YYYY  Date of CABG procedure ____/_____/_______ DD/MMM/YYYY    Previous Medical History   Does the Subject have a history of MI prior to the Index ACS Event '[]'   Yes  OR   '[x]'  NO   Date of Most recent MI prior to Index ACS  Event  ____/_____/______ DD/MMM/YYYY  Does the subject have a history of stroke '[]'   Yes   OR   '[x]'  NO   Does the subject have a history of peripheral vascular disease '[]'   Yes  OR   '[x]'  NO   Does the subject have a history of hypertension '[]'   Yes   OR   '[x]'  NO   Does the subject have a history of diabetes '[]'   Yes   OR   '[x]'  NO    Post ACS Status at Discharge   Left Ventricular Ejection fraction (LVEF) Result_____55_______  Blood Pressure _126___/_74____  Renal Function (eGFR) __60_____ml/min/1.73 m2  Lipid-C Lowering Therapy (meds taken at time of index MI  __Yes____________________  Statin Generic Name and dosage __Prevastatin 80 mg__________  Ezetimibe dose _____________________________________  History of other CV medications prior to Index ACS Event '[]'  Beta-Blocker '[]'  Antiplatelet '[]'  ACEi or ARB '[]' Mineralocorticoid antagonists '[]' Oral Loop Diuretics '[]'  Anticoagulants '[]' Unknown '[x]' None   Statin Intolerance Yes  Statin Drug Name Atorvastatin and Rosuvastatin  Any Medical history of Statin Intolerance '[x]'   Yes   OR   '[]'  NO    Drug Name:__Atorvastatin__________ Indication:____Hyperlipidemia Dose:_____80 mg__________ Route:___Oral___Daily_____________ Symptoms: Muscle Aches  Drug Name:__Rosuvastatin__________ Indication:____Hyperlipidemia Dose:_____80 mg__________ Route:___Oral___Daily_____________ Symptoms: Muscle Aches   Where there any documented symptoms of intolerance related to this statin at this dose '[x]'   Yes   OR   '[]'  NO If yes:__Muscle Aches_ _____________________________________   If other symptoms     Were there any documented abnormal laboratory findings related to this statin at this dose '[]'   Yes   OR   '[x]'  NO      Was smoking status assessed '[x]'   Yes   OR   '[]'  NO   Type of Substance '[]'  Tobacco - '[x]'   Yes   OR   '[]'  NO                     '[]' Former                     '[x]'  Current                     '[]' Never '[]'  Chewing Tobacco - '[]'   Yes OR '[x]'   NO                     '[]' Former                     '[]'  Current                     '[]' Never '[]'  Marijuana - '[]'   Yes   OR   '[x]'  NO                     '[]' Former                     '[]'   Current                     '[]' Never '[]' Vape - '[]'   Yes   OR   '[x]'  NO                     '[]' Former                     '[]'  Current                      '[]' Never '[]'  Other_______________________________    Vital Signs   Date of Measurement _07___/_DEC____/__2022_____ DD/MMM/YYYY  Height/Hight Unit and Weight/Weight Units _____68__________ inches ____169___________ Pounds  Pulse Rate ______76_________ Beats per Minute  Systolic Blood Pressure ___683__________   Diastolic Blood Pressure ______41_________  Blood Pressure Position ____sitting____________   Was Central Laboratory Assessment performed '[x]'   Yes   OR   '[]'  NO Date of Collection: _07_/_DEC_/_2022___    DD/MMM/YYYY Was the Patient Fasting:  '[x]'   Yes   OR   '[]'  NO   Accession Number [id number]  "Is the subject of childbearing potential? '[]'   Yes   OR   '[x]'  NO   Serum Pregnancy Test: Was Central laboratory assessment performed?  (Visit 1 and Unscheduled) '[]'   Yes   OR   '[x]'  NO Reason Not done:_________________________  Hematology: Was Central Laboratory assessment performed? (Visit 1, 2, 5, and unscheduled) '[x]'   Yes   OR   '[]'  NO Reason Not done:_________________________  Hepatitis: Was Central Laboratory assessment performed? (Visit1 and Unscheduled) '[x]'   Yes   OR   '[]'  NO Reason Not done:_________________________  Chemistry: Was Central Laboratory Assessment performed? (Visit 1, 2, 3, 4, 5 and Unscheduled) '[x]'   Yes   OR   '[]'  NO Reason Not done:_________________________  Lipoprotein(s): Was Central Laboratory Assessment performed?  (Visit 1, 2, 3, 4, 5 and Unscheduled) '[x]'   Yes   OR   '[]'  NO Reason Not done:_________________________  Coagulation: Was Central Laboratory Assessment performed? (Visit 1, 2, and unscheduled) '[x]'   Yes   OR   '[]'  NO Reason Not  done:_________________________  Fasting Glucose: Was Central Laboratory Assessment performed?  (Visit 1, 2, 3, 4, 5 and Unscheduled) '[x]'   Yes   OR   '[]'  NO Reason Not done:_________________________  BioBank Sample: Was Central Laboratory Assessment performed?  (Visit 2, 3, 4, 5 and Unscheduled) '[]'   Yes   OR   '[x]'  NO Reason Not done:___Visit 1________   Was Electrocardiogram (ECG) performed? (Visit 1 and Unscheduled) '[x]'   Yes   OR   '[]'  NO Reason Not done:______________________  Date of Assessment _07___/_DEC____/__2022_____ DD/MMM/YYYY  Time of Assessment ____1122____ (24 Hour Format)  Any Clinically Significant abnormalities '[]'   Yes   OR   '[x]'  NO     Were there any Adverse Events Experienced? Yes '[]'    No '[x]'    If yes, complete the following Description of event: __________________________________________________________________ __________________________________________________________________ __________________________________________________________________ __________________________________________________________________ __________________________________________________________________ __________________________________________________________________   Is this AE related to an injection site Reaction Yes '[]'     No '[]'   If yes please provide information below Anatomical Location: ___________________ Laterality: ____________________________ Bruising: Yes '[]'    No '[]'  Erythema: Yes '[]'   No '[]'  Induration: Yes '[]'    No '[]'  Lipodystrophy: Yes  '[]'   No '[]'  Necrosis: Yes  '[]'   No '[]'  Oedema: Yes '[]'   No '[]'  Pain: Yes  '[]'   No '[]'  Pallor/Pigment Change: Yes '[]'     No '[]'  Phlebitis: Yes  '[]'   No '[]'  Purtitus:   Yes '[]'    No '[]'  Rash: Yes '[]'    No '[]'  Tenderness:  Yes '[]'    No '[]'  Ulceration: Yes  '[]'  No '[]'  Warmth: Yes '[]'    No '[]'  Additional Symptoms: ________________________________  _________________________________  Was the Adverse Event serious Yes '[]'    No '[]'   Serious Criteria   Start Date  ____/_____/_______  DD/MMM/YYYY  End Date ____/_____/_______  DD/MMM/YYYY  Outcome '[]'  Resolved/Recovered '[]'  Not Recovered/Not Resolved '[]'  Fatal  Severity  '[]'  Mild '[]'  Moderate '[]'  Severe  Relationship to Study Treatment __________________________________________________________________  Action Treatment with Study Treatment '[]'  No Action Taken '[]' Dose Interrupted '[]'  Drug Withdrawal '[]'  Not Applicable '[]'  Unknown  Was a concomitant or additional treatment given due to this adverse event Yes  '[]'   No '[]'  (if yes, add meds/procedures to list)  If AE led to study discontinuation select Yes Yes  '[]'   No '[]'    Were any Surgical/Medical Procedures reported since signing of Consent Yes '[]'    No'[x]'    Surgery/Procedure: _____________________  Indication: ___________________________  Start Date: ____/_____/_______                     DD/MMM/YYYY Ongoing: Yes '[]'    No'[]'   End Date: ____/_____/_______                    DD/MMM/YYYY   Has the subject been hospitalized since Qualifying Hospitalization Yes '[]'    No'[x]'    Elective '[]'  Planned '[]'  Unplanned '[]'  Date of Admission: ____/_____/_______                         DD/MMM/YYYY Reason for Admission:__________________ ____________________________________ _____________________________________  Was Subject Discharged: Yes '[]'    No'[]'  Date of Discharge: ____/_____/_______                                DD/MMM/YYYY Discharge Disposition: _________________    Total number of days in intensive care unit or coronary care unit:________________   Has the subject had any emergency room visits (for less than 24 hours) Yes '[]'    No'[x]'    Date of Visit: ____/_____/_______                         DD/MMM/YYYY Was Visit for an AE: Yes '[]'    No'[]'  Reason for Visit:__________________ ____________________________________ _____________________________________ Was a concomitant or additional treatment given due to this visit: Yes '[]'    No'[]'    Has the subject had any  other clinical or healthcare professional consultations or visits Yes '[]'    No'[x]'    Professional Type: ____________________  Date of Visit: ____/_____/_______                         DD/MMM/YYYY Was visit an Adverse Event: Yes '[]'    No'[]'  Reason for Visit: _____________________     Was a concomitant or additional treatment given due to this visit: Yes '[]'    No'[]'      Outpatient Encounter Medications as of 12/19/2020  Medication Sig   aspirin 81 MG EC tablet Take 1 tablet (81 mg total) by mouth daily. Swallow whole.   atorvastatin (LIPITOR) 80 MG tablet Take 1 tablet (80 mg total) by mouth daily.   levothyroxine (SYNTHROID, LEVOTHROID) 100 MCG tablet Take 100 mcg by mouth daily before breakfast.   LORazepam (ATIVAN) 0.5 MG tablet Take 0.5  mg by mouth at bedtime as needed for sleep.   metoprolol succinate (TOPROL-XL) 25 MG 24 hr tablet Take 1 tablet (25 mg total) by mouth daily.   nicotine polacrilex (NICORETTE) 2 MG gum Take 1 each (2 mg total) by mouth as needed for smoking cessation.   nitroGLYCERIN (NITROSTAT) 0.4 MG SL tablet Place 1 tablet (0.4 mg total) under the tongue every 5 (five) minutes as needed for chest pain.   tadalafil (CIALIS) 5 MG tablet Take 1 tablet (5 mg total) by mouth daily as needed for erectile dysfunction. Do not take in combination with nitro. Nitro can be taken 48 hrs after cialis.   ticagrelor (BRILINTA) 90 MG TABS tablet Take 1 tablet (90 mg total) by mouth 2 (two) times daily.   No facility-administered encounter medications on file as of 12/19/2020.      Form Based on: CFR Completion Guidelines Trial: [id number] Number 2.0 Template Version 4.0, Template Effective Date: 26-Nov-2018

## 2020-12-19 NOTE — Progress Notes (Signed)
Patient ID: COLTEN DESROCHES                 DOB: 01/24/1955                    MRN: [id number]     HPI: John Randolph is a 65 y.o. male patient of Dr Anne Fu referred to lipid clinic by Bettina Gavia, PA. PMH is significant for NSTEMI 11/24/20 with LHC showing single vessel obstructive CAD with culprit lesion in the prox OM1 and moderate nonobstructive disease in the LAD and RCA. He underwent PCI of the LCx/OM1 with DES x1. EF was 50-55% with left ventricular grade I diastolic dysfunction. Also has hx of HLD, OSA on CPAP (using once a week), tobacco use smoking 2 PPD, prior coronary calcifications seen on chest CT for lung cancer screening, preDM with A1c of 5.8%, and hypothyroidism. He was changed from pravastatin 80mg  to atorvastatin 80mg  at discharge.  Pt presents today for follow up. Previously took pravastatin but wasn't very adherent, took about 3 doses a week. Has been taking his atorvastatin daily since his MI. Has been tolerating rechallenge of atorvastatin well. Did previously experience muscle aches on a few statins noted below. Is changing insurance plans Jan 1 of next year. Had a screening visit for V-inception trial today for inclisiran, did sign informed consent and had baseline labs checked.  Current Medications: atorvastatin 80mg  daily Intolerances: atorvastatin, rosuvastatin, simvastatin - muscle aches Risk Factors: CAD s/p NSTEMI, tobacco abuse LDL goal: 70mg /dL  Diet: Likes meat and potatoes. Has had less of an appetite recently  Exercise: Played 18 holes of golf last week. Not planning on cardiac rehab.  Family History: No family history on file.   Social History: Smokes 2 PPD, has been smoking for 45 years. Occasional alcohol use - crown and coke or peach crown and OJ, denies drug use.   Labs: 11/24/20: TC 234, TG 247, HDL 38, LDL 147 (pravastatin 80mg  daily - taking 3x a week)  Past Medical History:  Diagnosis Date   Hyperlipidemia    Hypertension     Hypothyroidism    Sleep apnea    DX years ago but never used CPAP   Thyroid disease    hypothyroidism    Current Outpatient Medications on File Prior to Visit  Medication Sig Dispense Refill   aspirin 81 MG EC tablet Take 1 tablet (81 mg total) by mouth daily. Swallow whole. 90 tablet 3   atorvastatin (LIPITOR) 80 MG tablet Take 1 tablet (80 mg total) by mouth daily. 30 tablet 11   levothyroxine (SYNTHROID, LEVOTHROID) 100 MCG tablet Take 100 mcg by mouth daily before breakfast.     LORazepam (ATIVAN) 0.5 MG tablet Take 0.5 mg by mouth at bedtime as needed for sleep.     metoprolol succinate (TOPROL-XL) 25 MG 24 hr tablet Take 1 tablet (25 mg total) by mouth daily. 90 tablet 3   nicotine polacrilex (NICORETTE) 2 MG gum Take 1 each (2 mg total) by mouth as needed for smoking cessation. 100 tablet 0   nitroGLYCERIN (NITROSTAT) 0.4 MG SL tablet Place 1 tablet (0.4 mg total) under the tongue every 5 (five) minutes as needed for chest pain. 25 tablet 3   tadalafil (CIALIS) 5 MG tablet Take 1 tablet (5 mg total) by mouth daily as needed for erectile dysfunction. Do not take in combination with nitro. Nitro can be taken 48 hrs after cialis. 10 tablet 0   ticagrelor (BRILINTA) 90 MG  TABS tablet Take 1 tablet (90 mg total) by mouth 2 (two) times daily. 180 tablet 3   No current facility-administered medications on file prior to visit.    Allergies  Allergen Reactions   Crestor [Rosuvastatin] Other (See Comments)    Aches    Hydrochlorothiazide Other (See Comments)   Lipitor [Atorvastatin] Other (See Comments)    Aches    Other Other (See Comments)   Zocor [Simvastatin] Other (See Comments)    aches    Assessment/Plan:  1. Hyperlipidemia - LD previously 147mg /dL on pravastatin 80mg  ~3 days a week. Currently tolerating atorvastatin 80mg  daily which will notably improve LDL but likely not bring it to goal < 70 given his ASCVD hx. He had a research visit this AM for V-inception trial  investigating inclisiran in post ACS patients. Did discuss other lipid lowering options today including PCSK9i or Zetia, insurance should cover all above options well. Pt prefers to continue with the trial. Is aware of 50% potential for receiving placebo. Advised him to let me know if he doesn't end up meeting inclusion criteria based on screening labs today and can pursue PCSK9i. Of note, he is plans Jan 1. Encouraged him to reduce intake of red meat as well as change to lower sugar drinks to help lower his TG.  Tanush Drees E. Draco Malczewski, PharmD, BCACP, CPP [facility] [address], [place], [address] Phone: [phone]; Fax: [phone] 12/19/2020 3:06 PM

## 2020-12-19 NOTE — Patient Instructions (Addendum)
Your LDL was 147 on pravastatin and your goal is < 70 because of your heart attack  Continue taking atorvastatin 80mg  daily  Keep your follow up with the research trial  If for some reason you do not end up qualifying or if you drop out, let me know and we can consider a different cholesterol medication: - Praluent or Repatha - twice monthly subcutaneous injections you give yourself that lower your LDL by 60% and help to reduce your risk of having a heart attack or stroke - Zetia (ezetimibe) - daily pill that lowers your LDL by an extra 20%  - Try to decrease intake of saturated fats found in red meats, fried food, and fast food to help reduce your LDL cholesterol -Try to change to lower sugar drinks (less juice or diet soda) to help lower your triglycerides

## 2020-12-20 ENCOUNTER — Other Ambulatory Visit (HOSPITAL_COMMUNITY): Payer: Self-pay

## 2020-12-21 NOTE — Research (Addendum)
V-Inception Visit 1 lab Results  Are there any labs that are clinically significant?  Yes [x]  OR No[]   (LCLc 67 Excluded)  Is the patient eligible to continue enrollment in the study after screening visit?  Yes []   OR No[x]    V-Inception Screening Phase   Date of Screening Phase _07___/_DEC____/__2022_____ DD/MMM/YYYY  Subject Status []  Completed [x]  Screen Failure []  Adverse Event (subject prevented from getting study drug at Baseline. Only if the adverse event is considered an SAE, ensure it has been entered on the AE CRF and report to .  []  Death (only if occurred during screening phase) []  Lost to Follow-Up []  Physician Decision []  Pregnancy []  Progressive Disease [] Protocol Deviation []  Study Terminated by Sponsor []  Technical Problems []  Subject Decision []  COVID  Specific Decision (physician decision and subject decision)  ________________________________ _________________________________  Provide COVID-19 Relationship _________________________________  Date of Death [x]  Not Applicable or ____/_____/_______                                     DD/MMM/YYYY  Cause of Death [x]  Not Applicable or Cause ___________________________ __________________________________________________________________

## 2020-12-24 NOTE — Research (Signed)
Patient seen as screening for V Inception Protocol.  Recent presentation undergoing stenting for ACS.  Long term smoker, and continues though has cut down on his intake.  He is currently on atorvastatin 80 mg per day and tolerating relatively well.  Previously has issues and was on pravastatin three time per week.    Protocol explained to patient, including potential for randomization with 50/50 likelihood of receiving Inclisiran.  Discussed in detail.  Patient here for screening visit, and consent signed. Has appointment to be seen in lipid clinic as well.   Currently chest pain free.   VSS as noted.  No JVD Lungs clear with slight prolonged expiration Cor regular without murmurs No extremity edema Neurological non focal.  [place] Class I  Patient to have screening laboratories, and further pending.    Arturo Morton. Riley Kill, MD, [facility], [facility] Medical Director, [facility]

## 2020-12-24 NOTE — Research (Signed)
Additional Labs V-inception Screening Visit

## 2020-12-31 ENCOUNTER — Encounter (HOSPITAL_BASED_OUTPATIENT_CLINIC_OR_DEPARTMENT_OTHER): Payer: Self-pay

## 2020-12-31 DIAGNOSIS — E782 Mixed hyperlipidemia: Secondary | ICD-10-CM

## 2021-01-04 ENCOUNTER — Telehealth (HOSPITAL_COMMUNITY): Payer: Self-pay

## 2021-01-04 NOTE — Telephone Encounter (Signed)
Called patient to see if he was interested in participating in the Cardiac Rehab Program. Patient stated yes. Patient will come in for orientation on 02/05/2021@10 :30am and will attend the 1:00pm exercise class.   Pensions consultant.

## 2021-01-16 NOTE — Telephone Encounter (Signed)
Pt insurance is active and benefits verified through [employer] $40, DED $1,000/0 met, out of pocket $4,500/0 met, co-insurance 0%. no pre-authorization required. Passport, 01/16/2021_0 :30pm, REF# [id number]

## 2021-01-22 ENCOUNTER — Encounter: Payer: Self-pay | Admitting: Pharmacist

## 2021-01-22 MED ORDER — ROSUVASTATIN CALCIUM 10 MG PO TABS: 10.0000 mg | ORAL_TABLET | Freq: Every day | ORAL | 5 refills | Status: DC

## 2021-01-22 NOTE — Addendum Note (Signed)
Addended by: Alyson Ingles on: 01/22/2021 12:05 PM   Modules accepted: Orders

## 2021-01-22 NOTE — Addendum Note (Signed)
Addended by: Gillian Meeuwsen E on: 01/22/2021 01:45 PM   Modules accepted: Orders

## 2021-02-04 ENCOUNTER — Telehealth (HOSPITAL_COMMUNITY): Payer: Self-pay | Admitting: *Deleted

## 2021-02-04 NOTE — Telephone Encounter (Signed)
Contacted John Randolph re: orientation to cardiac rehab and he has decided he will not participate due to a $40 co-pay on each visit.

## 2021-02-05 ENCOUNTER — Inpatient Hospital Stay (HOSPITAL_COMMUNITY): Admission: RE | Admit: 2021-02-05 | Payer: BC Managed Care – PPO | Source: Ambulatory Visit

## 2021-02-06 NOTE — Progress Notes (Signed)
Patient ID: John Randolph                 DOB: Dec 03, 1955                    MRN: [id number]     HPI: John Randolph is a 66 y.o. male patient of Dr. Anne Fu referred to lipid clinic by Bettina Gavia, PA. PMH is significant for NSTEMI 11/24/20 with LHC showing single vessel obstructive CAD with culprit lesion in the prox OM1 and moderate nonobstructive disease in the LAD and RCA. He underwent PCI of the LCx/OM1 with DES x1. ECHO in 11/22 showed a LVEF of 50-55% with left ventricular grade I diastolic dysfunction. Also has hx of HLD, OSA on CPAP (using once a week), tobacco use smoking 2 PPD, prior coronary calcifications seen on chest CT for lung cancer screening, preDM with A1c of 5.8%, and hypothyroidism. At discharge from hospital stay for NSTEMI, he was changed from pravastatin 80mg  to atorvastatin 80mg  daily.  At last PharmD visit on 12/7, he states that he previously took pravastatin but wasn't very adherent, took about 3 doses a week. Had been taking his atorvastatin daily since his MI. Had been tolerating rechallenge of atorvastatin well. Did previously experience muscle aches on a few statins noted below. Had a screening visit for V-inception trial for inclisiran the same day and signed informed consent and had baseline labs checked. Pt wanted to move forward with starting the inclisiran trial over starting a PCSK9i or Zetia. Advised patient to f/u if he did not end up enrolled in the clinical trial to talk through his other options.  On 12/19, the patient sent a message reporting constant diarrhea and loss of appetite since discharge from the hospital on 11/27/20 and switch from pravastatin to atorvastatin 80 mg daily. Pt was advised to stop atorvastatin for 2 weeks to see if symptoms subsided. On 1/10, pt reported his GI symptoms had resolved since being off atorvastatin 80mg . I changed pt to rosuvastatin 10 mg daily with plan to titrate as needed according to tolerability.   Pt presents to  clinic in good spirits. States he was not enrolled in the inclisiran trial as he was excluded d/t his screening lipid labs being <70 mg/dL on atorvastatin 80 mg daily. He spoke about feeling like he was sick and congested after his discharge from the hospital, so believed him GI symptoms were related to this. However, the cold symptoms subsided, but the GI symptoms persisted. The symptoms started around 3 days after starting atorvastatin 80mg . He was off of the atorvastatin 80mg  for 2 weeks and the symptoms subsided. He recalled having leg pain with rosuvastatin in the past (unsure of dose), but has been adherent and doing well so far on rosuvastatin 10 mg daily. He spoke about having some SOB recently as well, both during rest and movement, about 3x a week. He is working to increase physical activity using his treadmill at home. Not pursuing cardiac rehab due to cost.  Current Medications: rosuvastatin 10mg  daily (PM) Intolerances: atorvastatin, rosuvastatin, simvastatin - leg pain, muscle aches, atorvastatin 80 mg (loss of appetite, diarrhea) Risk Factors: CAD s/p NSTEMI, tobacco abuse LDL goal: 70mg /dL  Diet: Likes meat and potatoes. Has had less of an appetite recently  Exercise: Had been using elliptical and treadmill at home.  Family History: No family history on file.   Social History: Smokes 2 PPD, has been smoking for 45 years. Occasional alcohol use - crown  and coke or peach crown and OJ, denies drug use.   Labs: 11/24/20: TC 234, TG 247, HDL 38, LDL 147 (pravastatin 80mg  daily - taking 3x a week)  Past Medical History:  Diagnosis Date   Hyperlipidemia    Hypertension    Hypothyroidism    Sleep apnea    DX years ago but never used CPAP   Thyroid disease    hypothyroidism    Current Outpatient Medications on File Prior to Visit  Medication Sig Dispense Refill   aspirin 81 MG EC tablet Take 1 tablet (81 mg total) by mouth daily. Swallow whole. 90 tablet 3   levothyroxine  (SYNTHROID, LEVOTHROID) 100 MCG tablet Take 100 mcg by mouth daily before breakfast.     LORazepam (ATIVAN) 0.5 MG tablet Take 0.5 mg by mouth at bedtime as needed for sleep.     metoprolol succinate (TOPROL-XL) 25 MG 24 hr tablet Take 1 tablet (25 mg total) by mouth daily. 90 tablet 3   nicotine polacrilex (NICORETTE) 2 MG gum Take 1 each (2 mg total) by mouth as needed for smoking cessation. (Patient not taking: Reported on 01/31/2021) 100 tablet 0   nitroGLYCERIN (NITROSTAT) 0.4 MG SL tablet Place 1 tablet (0.4 mg total) under the tongue every 5 (five) minutes as needed for chest pain. 25 tablet 3   rosuvastatin (CRESTOR) 10 MG tablet Take 1 tablet (10 mg total) by mouth daily. (Patient taking differently: Take 10 mg by mouth every evening.) 30 tablet 5   tadalafil (CIALIS) 5 MG tablet Take 1 tablet (5 mg total) by mouth daily as needed for erectile dysfunction. Do not take in combination with nitro. Nitro can be taken 48 hrs after cialis. 10 tablet 0   ticagrelor (BRILINTA) 90 MG TABS tablet Take 1 tablet (90 mg total) by mouth 2 (two) times daily. 180 tablet 3   No current facility-administered medications on file prior to visit.    Allergies  Allergen Reactions   Crestor [Rosuvastatin] Other (See Comments)    Aches    Hydrochlorothiazide Other (See Comments)    dizziness   Lipitor [Atorvastatin] Other (See Comments)    Aches and diarrhea    Zocor [Simvastatin] Other (See Comments)    aches    Assessment/Plan:  1. Hyperlipidemia - Pt LDL was at goal of <70 mg/dL on atorvastatin 80mg  daily which excluded him from the V-inception trial investigating inclisiran. Lipid panel results were blinded and he was since changed to rosuvastatin 10mg  daily due to GI intolerance on atorvastatin 80mg  daily. Has tolerated rosuvastatin 10 mg daily for the past 2 weeks and is agreeable to increase to 20 mg daily and monitor for myalgias. F/u labs scheduled in 4 weeks to assess lipids. If LDL is elevated  at f/u, can continue rosuvastatin dose titration if tolerating well or Zetia or PCSK9i.   2. SOB - Sporadic, has occurred at both rest and with activity. May be due to his Brilinta. Advised him to have a bit of caffeine with the Brilinta to see if this helps. If not, will let 02/02/2021 know.  , PharmD Student assisted with this visit.   Megan E. Supple, PharmD, BCACP, CPP [facility] [address], [place], [place] John Randolph Phone: [phone]; Fax: [phone] 02/06/2021 4:44 PM

## 2021-02-07 ENCOUNTER — Ambulatory Visit: Payer: BC Managed Care – PPO | Admitting: Pharmacist

## 2021-02-07 ENCOUNTER — Other Ambulatory Visit: Payer: Self-pay

## 2021-02-07 DIAGNOSIS — E782 Mixed hyperlipidemia: Secondary | ICD-10-CM

## 2021-02-07 MED ORDER — ROSUVASTATIN CALCIUM 20 MG PO TABS: 20.0000 mg | ORAL_TABLET | Freq: Every day | ORAL | 5 refills | Status: DC

## 2021-02-07 NOTE — Patient Instructions (Addendum)
It was nice to see you today!  We will increase your rosuvastatin to 20 mg daily. You can take two tablets of your remaining rosuvastatin 10 mg tablets until they are gone.   We will send in a new prescription to your local pharmacy for rosuvastatin 20 mg daily.  We will have you come in for labs on March 1st, 2023, anytime after 7:30 AM fasting.   Continue to eat a healthy diet and exercise as able.

## 2021-02-11 ENCOUNTER — Ambulatory Visit (HOSPITAL_COMMUNITY): Payer: BC Managed Care – PPO

## 2021-02-13 ENCOUNTER — Ambulatory Visit (HOSPITAL_COMMUNITY): Payer: BC Managed Care – PPO

## 2021-02-15 ENCOUNTER — Ambulatory Visit (HOSPITAL_COMMUNITY): Payer: BC Managed Care – PPO

## 2021-02-18 ENCOUNTER — Ambulatory Visit (HOSPITAL_COMMUNITY): Payer: BC Managed Care – PPO

## 2021-02-20 ENCOUNTER — Ambulatory Visit (HOSPITAL_COMMUNITY): Payer: BC Managed Care – PPO

## 2021-02-22 ENCOUNTER — Ambulatory Visit (HOSPITAL_COMMUNITY): Payer: BC Managed Care – PPO

## 2021-02-25 ENCOUNTER — Encounter: Payer: Self-pay | Admitting: Pharmacist

## 2021-02-25 ENCOUNTER — Ambulatory Visit (HOSPITAL_COMMUNITY): Payer: BC Managed Care – PPO

## 2021-02-26 ENCOUNTER — Other Ambulatory Visit: Payer: Self-pay | Admitting: Pharmacist

## 2021-02-26 MED ORDER — ROSUVASTATIN CALCIUM 20 MG PO TABS: 20.0000 mg | ORAL_TABLET | Freq: Every day | ORAL | 3 refills | Status: DC

## 2021-02-27 ENCOUNTER — Ambulatory Visit (HOSPITAL_COMMUNITY): Payer: BC Managed Care – PPO

## 2021-03-01 ENCOUNTER — Ambulatory Visit (HOSPITAL_COMMUNITY): Payer: BC Managed Care – PPO

## 2021-03-04 ENCOUNTER — Other Ambulatory Visit: Payer: Self-pay | Admitting: Pharmacist

## 2021-03-04 ENCOUNTER — Ambulatory Visit (HOSPITAL_COMMUNITY): Payer: BC Managed Care – PPO

## 2021-03-04 MED ORDER — ASPIRIN 81 MG PO TBEC: 81.0000 mg | DELAYED_RELEASE_TABLET | Freq: Every day | ORAL | 3 refills | Status: DC

## 2021-03-06 ENCOUNTER — Ambulatory Visit (HOSPITAL_COMMUNITY): Payer: BC Managed Care – PPO

## 2021-03-08 ENCOUNTER — Ambulatory Visit (HOSPITAL_COMMUNITY): Payer: BC Managed Care – PPO

## 2021-03-11 ENCOUNTER — Ambulatory Visit (HOSPITAL_COMMUNITY): Payer: BC Managed Care – PPO

## 2021-03-13 ENCOUNTER — Other Ambulatory Visit: Payer: BC Managed Care – PPO | Admitting: *Deleted

## 2021-03-13 ENCOUNTER — Ambulatory Visit (HOSPITAL_COMMUNITY): Payer: BC Managed Care – PPO

## 2021-03-13 ENCOUNTER — Other Ambulatory Visit: Payer: Self-pay

## 2021-03-13 DIAGNOSIS — E782 Mixed hyperlipidemia: Secondary | ICD-10-CM | POA: Diagnosis not present

## 2021-03-13 LAB — LIPID PANEL
Chol/HDL Ratio: 2.9 ratio (ref 0.0–5.0)
Cholesterol, Total: 116 mg/dL (ref 100–199)
HDL: 40 mg/dL (ref >39)
LDL Chol Calc (NIH): 60 mg/dL (ref 0–99)
Triglycerides: 79 mg/dL (ref 0–149)
VLDL Cholesterol Cal: 16 mg/dL (ref 5–40)

## 2021-03-13 LAB — HEPATIC FUNCTION PANEL
ALT: 11 IU/L (ref 0–44)
AST: 17 IU/L (ref 0–40)
Albumin: 4.1 g/dL (ref 3.8–4.8)
Alkaline Phosphatase: 102 IU/L (ref 44–121)
Bilirubin Total: 0.5 mg/dL (ref 0.0–1.2)
Bilirubin, Direct: 0.19 mg/dL (ref 0.00–0.40)
Total Protein: 6.6 g/dL (ref 6.0–8.5)

## 2021-03-15 ENCOUNTER — Ambulatory Visit (HOSPITAL_COMMUNITY): Payer: BC Managed Care – PPO

## 2021-03-18 ENCOUNTER — Ambulatory Visit (HOSPITAL_COMMUNITY): Payer: BC Managed Care – PPO

## 2021-03-20 ENCOUNTER — Ambulatory Visit (HOSPITAL_COMMUNITY): Payer: BC Managed Care – PPO

## 2021-03-22 ENCOUNTER — Ambulatory Visit (HOSPITAL_COMMUNITY): Payer: BC Managed Care – PPO

## 2021-03-25 ENCOUNTER — Ambulatory Visit (HOSPITAL_COMMUNITY): Payer: BC Managed Care – PPO

## 2021-03-27 ENCOUNTER — Ambulatory Visit (HOSPITAL_COMMUNITY): Payer: BC Managed Care – PPO

## 2021-03-29 ENCOUNTER — Ambulatory Visit (HOSPITAL_COMMUNITY): Payer: BC Managed Care – PPO

## 2021-04-01 ENCOUNTER — Ambulatory Visit (HOSPITAL_COMMUNITY): Payer: BC Managed Care – PPO

## 2021-04-03 ENCOUNTER — Ambulatory Visit (HOSPITAL_COMMUNITY): Payer: BC Managed Care – PPO

## 2021-04-04 DIAGNOSIS — Z0000 Encounter for general adult medical examination without abnormal findings: Secondary | ICD-10-CM | POA: Diagnosis not present

## 2021-04-04 DIAGNOSIS — Z23 Encounter for immunization: Secondary | ICD-10-CM | POA: Diagnosis not present

## 2021-04-04 DIAGNOSIS — E782 Mixed hyperlipidemia: Secondary | ICD-10-CM | POA: Diagnosis not present

## 2021-04-04 DIAGNOSIS — I10 Essential (primary) hypertension: Secondary | ICD-10-CM | POA: Diagnosis not present

## 2021-04-04 DIAGNOSIS — Z125 Encounter for screening for malignant neoplasm of prostate: Secondary | ICD-10-CM | POA: Diagnosis not present

## 2021-04-04 DIAGNOSIS — E039 Hypothyroidism, unspecified: Secondary | ICD-10-CM | POA: Diagnosis not present

## 2021-04-05 ENCOUNTER — Ambulatory Visit (HOSPITAL_COMMUNITY): Payer: BC Managed Care – PPO

## 2021-04-24 ENCOUNTER — Other Ambulatory Visit: Payer: Self-pay | Admitting: *Deleted

## 2021-04-24 DIAGNOSIS — Z87891 Personal history of nicotine dependence: Secondary | ICD-10-CM

## 2021-04-24 DIAGNOSIS — F17210 Nicotine dependence, cigarettes, uncomplicated: Secondary | ICD-10-CM

## 2021-04-24 DIAGNOSIS — Z122 Encounter for screening for malignant neoplasm of respiratory organs: Secondary | ICD-10-CM

## 2021-05-08 ENCOUNTER — Ambulatory Visit (INDEPENDENT_AMBULATORY_CARE_PROVIDER_SITE_OTHER)
Admission: RE | Admit: 2021-05-08 | Discharge: 2021-05-08 | Disposition: A | Discharge status: Home / Self Care | Payer: BC Managed Care – PPO | Source: Ambulatory Visit | Attending: Acute Care | Admitting: Acute Care

## 2021-05-08 DIAGNOSIS — Z87891 Personal history of nicotine dependence: Secondary | ICD-10-CM | POA: Diagnosis not present

## 2021-05-08 DIAGNOSIS — F17210 Nicotine dependence, cigarettes, uncomplicated: Secondary | ICD-10-CM | POA: Diagnosis not present

## 2021-05-08 DIAGNOSIS — Z122 Encounter for screening for malignant neoplasm of respiratory organs: Secondary | ICD-10-CM

## 2021-05-09 ENCOUNTER — Other Ambulatory Visit: Payer: Self-pay

## 2021-05-09 DIAGNOSIS — Z87891 Personal history of nicotine dependence: Secondary | ICD-10-CM

## 2021-05-09 DIAGNOSIS — Z122 Encounter for screening for malignant neoplasm of respiratory organs: Secondary | ICD-10-CM

## 2021-05-09 DIAGNOSIS — F17210 Nicotine dependence, cigarettes, uncomplicated: Secondary | ICD-10-CM

## 2021-05-21 DIAGNOSIS — H40053 Ocular hypertension, bilateral: Secondary | ICD-10-CM | POA: Diagnosis not present

## 2021-06-17 DIAGNOSIS — Z1211 Encounter for screening for malignant neoplasm of colon: Secondary | ICD-10-CM | POA: Diagnosis not present

## 2021-10-08 ENCOUNTER — Ambulatory Visit (INDEPENDENT_AMBULATORY_CARE_PROVIDER_SITE_OTHER): Payer: BC Managed Care – PPO

## 2021-10-08 ENCOUNTER — Ambulatory Visit
Admission: EM | Admit: 2021-10-08 | Discharge: 2021-10-08 | Disposition: A | Discharge status: Home / Self Care | Payer: BC Managed Care – PPO | Attending: Physician Assistant | Admitting: Physician Assistant

## 2021-10-08 DIAGNOSIS — M20012 Mallet finger of left finger(s): Secondary | ICD-10-CM

## 2021-10-08 DIAGNOSIS — M79645 Pain in left finger(s): Secondary | ICD-10-CM

## 2021-10-08 DIAGNOSIS — S6992XA Unspecified injury of left wrist, hand and finger(s), initial encounter: Secondary | ICD-10-CM | POA: Diagnosis not present

## 2021-10-08 NOTE — Discharge Instructions (Addendum)
  Please follow up with ortho if no improvement over the next week.

## 2021-10-08 NOTE — ED Provider Notes (Signed)
[facility]    CSN: [id number] Arrival date & time: 10/08/21  1508      History   Chief Complaint Chief Complaint  Patient presents with   Finger Injury    HPI John Randolph is a 66 y.o. male.   Patient here today for evaluation of suspected mallet deformity of left 5th finger that he first noticed when he removed his gloves after lifting and pulling on his pool cover that he approximates weighed about 30-40 lbs with water collected on it. He notes that he immediately noted flexion to his left 5th DIP which has remained since onset. He reports that he discussed same with his son in law who is a NP as well as his daughter who is a nurse who recommended splinting ASAP. He has not had any any pain, numbness or tingling.   The history is provided by the patient.    Past Medical History:  Diagnosis Date   Hyperlipidemia    Hypertension    Hypothyroidism    Sleep apnea    DX years ago but never used CPAP   Thyroid disease    hypothyroidism    Patient Active Problem List   Diagnosis Date Noted   NSTEMI (non-ST elevated myocardial infarction) (HCC) 11/25/2020   Mixed hyperlipidemia 11/25/2020   Coronary artery disease involving native coronary artery of native heart with angina pectoris (HCC) 11/25/2020   Obstructive sleep apnea 11/25/2020   Tobacco use 11/25/2020    Past Surgical History:  Procedure Laterality Date   CATARACT EXTRACTION Bilateral 2017   LAPAROSCOPIC INGUINAL HERNIA WITH UMBILICAL HERNIA N/A 03/12/2017   Procedure: LAPAROSCOPIC BILATERAL  INGUINAL HERNIA REPAIR WITH MESH AND OPEN UMBILICAL HERNIA REPAIR;  Surgeon: Sheliah Hatch De Blanch, MD;  Location: [facility];  Service: General;  Laterality: N/A;   LEFT HEART CATH AND CORONARY ANGIOGRAPHY N/A 11/26/2020   Procedure: LEFT HEART CATH AND CORONARY ANGIOGRAPHY;  Surgeon: [place], Peter M, MD;  Location: [facility];  Service: Cardiovascular;  Laterality: N/A;   RETINAL DETACHMENT SURGERY   2020   THYROIDECTOMY         Home Medications    Prior to Admission medications   Medication Sig Start Date End Date Taking? Authorizing Provider  aspirin 81 MG EC tablet Take 1 tablet (81 mg total) by mouth daily. Swallow whole. 03/04/21   Jake Bathe, MD  levothyroxine (SYNTHROID, LEVOTHROID) 100 MCG tablet Take 100 mcg by mouth daily before breakfast.    [provider]  LORazepam (ATIVAN) 0.5 MG tablet Take 0.5 mg by mouth at bedtime as needed for sleep.    [provider]  metoprolol succinate (TOPROL-XL) 25 MG 24 hr tablet Take 1 tablet (25 mg total) by mouth daily. 11/28/20   [facility], Roe Rutherford, PA  nicotine polacrilex (NICORETTE) 2 MG gum Take 1 each (2 mg total) by mouth as needed for smoking cessation. Patient not taking: Reported on 01/31/2021 11/27/20   Marcelino Duster, PA  nitroGLYCERIN (NITROSTAT) 0.4 MG SL tablet Place 1 tablet (0.4 mg total) under the tongue every 5 (five) minutes as needed for chest pain. 11/27/20   [facility], Roe Rutherford, PA  rosuvastatin (CRESTOR) 20 MG tablet Take 1 tablet (20 mg total) by mouth daily. 02/26/21   Jake Bathe, MD  tadalafil (CIALIS) 5 MG tablet Take 1 tablet (5 mg total) by mouth daily as needed for erectile dysfunction. Do not take in combination with nitro. Nitro can be taken 48 hrs after  cialis. 11/27/20   Ledora Bottcher, PA  ticagrelor (BRILINTA) 90 MG TABS tablet Take 1 tablet (90 mg total) by mouth 2 (two) times daily. 11/27/20   Ledora Bottcher, PA    Family History History reviewed. No pertinent family history.  Social History Social History   Tobacco Use   Smoking status: Every Day    Packs/day: 1.50    Years: 30.00    Total pack years: 45.00    Types: Cigarettes   Smokeless tobacco: Never  Vaping Use   Vaping Use: Never used  Substance Use Topics   Alcohol use: Yes    Comment: occassionally   Drug use: No     Allergies   Crestor [rosuvastatin], Hydrochlorothiazide, Lipitor  [atorvastatin], and Zocor [simvastatin]   Review of Systems Review of Systems  Constitutional:  Negative for chills and fever.  Eyes:  Negative for discharge and redness.  Respiratory:  Negative for shortness of breath.   Musculoskeletal:  Negative for arthralgias and joint swelling.  Skin:  Negative for color change and wound.  Neurological:  Negative for numbness.     Physical Exam Triage Vital Signs ED Triage Vitals [10/08/21 1648]  Enc Vitals Group     BP (!) 161/96     Pulse Rate 75     Resp 18     Temp 97.7 F (36.5 C)     Temp Source Oral     SpO2 98 %     Weight      Height      Head Circumference      Peak Flow      Pain Score      Pain Loc      Pain Edu?      Excl. in [place]?    No data found.  Updated Vital Signs BP (!) 161/96 (BP Location: Left Arm)   Pulse 75   Temp 97.7 F (36.5 C) (Oral)   Resp 18   SpO2 98%     Physical Exam Vitals and nursing note reviewed.  Constitutional:      General: He is not in acute distress.    Appearance: Normal appearance. He is not ill-appearing.  HENT:     Head: Normocephalic and atraumatic.  Eyes:     Conjunctiva/sclera: Conjunctivae normal.  Cardiovascular:     Rate and Rhythm: Normal rate.  Pulmonary:     Effort: Pulmonary effort is normal.  Musculoskeletal:     Comments: Flexion noted to left 5th DIP, patient unable to actively fully extend 5th DIP or flex 5th PIP but passive extension is possible. No TTP noted to 5th PIP or DIP, Normal ROM of left 5th MCP  Skin:    Capillary Refill: Normal cap refill to left 5th finger Neurological:     Mental Status: He is alert.     Comments: Gross sensation intact to distal left 5th finger  Psychiatric:        Mood and Affect: Mood normal.        Behavior: Behavior normal.      UC Treatments / Results  Labs (all labs ordered are listed, but only abnormal results are displayed) Labs Reviewed - No data to display  EKG   Radiology DG Finger Little  Left  Result Date: 10/08/2021 CLINICAL DATA:  Finger injury EXAM: LEFT LITTLE FINGER 2+V COMPARISON:  None Available. FINDINGS: No fracture or dislocation.  Mild flexion at the D IP joint. IMPRESSION: Flexion at the D IP joint.  No  fracture or subluxation Electronically Signed   By: Jasmine Pang M.D.   On: 10/08/2021 17:30    Procedures Procedures (including critical care time)  Medications Ordered in UC Medications - No data to display  Initial Impression / Assessment and Plan / UC Course  I have reviewed the triage vital signs and the nursing notes.  Pertinent labs & imaging results that were available during my care of the patient were reviewed by me and considered in my medical decision making (see chart for details).    Initially patient is resistant to xray but discussed that xray was recommended for all mallet deformities regardless and patient is eventually agreeable. No fracture noted, finger splinted and recommended further evaluation by ortho if no improvement.   Final Clinical Impressions(s) / UC Diagnoses   Final diagnoses:  Mallet deformity of left little finger     Discharge Instructions       Please follow up with ortho if no improvement over the next week.     ED Prescriptions   None    PDMP not reviewed this encounter.   Tomi Bamberger, PA-C 10/08/21 1744

## 2021-10-08 NOTE — ED Notes (Signed)
Applied brace to finger

## 2021-10-08 NOTE — ED Triage Notes (Addendum)
Pt reports an injury to his left pinky finger.  Reports a relative told him to put his finger in a splint for a couple of weeks.

## 2021-11-08 ENCOUNTER — Encounter: Payer: Self-pay | Admitting: Cardiology

## 2021-11-08 ENCOUNTER — Ambulatory Visit: Payer: BC Managed Care – PPO | Attending: Cardiology | Admitting: Cardiology

## 2021-11-08 VITALS — BP 110/60 | HR 64 | Ht 68.0 in | Wt 166.0 lb

## 2021-11-08 DIAGNOSIS — Z720 Tobacco use: Secondary | ICD-10-CM

## 2021-11-08 DIAGNOSIS — I25119 Atherosclerotic heart disease of native coronary artery with unspecified angina pectoris: Secondary | ICD-10-CM

## 2021-11-08 DIAGNOSIS — E782 Mixed hyperlipidemia: Secondary | ICD-10-CM

## 2021-11-08 NOTE — Patient Instructions (Addendum)
Medication Instructions:  Please discontinue your Brilinta. Decrease Metoprolol to 1/2 tablet daily for 3 to 4 days then discontinue. Continue all other medications as listed.  *If you need a refill on your cardiac medications before your next appointment, please call your pharmacy*  Follow-Up: At [facility], you and your health needs are our priority.  As part of our continuing mission to provide you with exceptional heart care, we have created designated Provider Care Teams.  These Care Teams include your primary Cardiologist (physician) and Advanced Practice Providers (APPs -  Physician Assistants and Nurse Practitioners) who all work together to provide you with the care you need, when you need it.  We recommend signing up for the patient portal called "MyChart".  Sign up information is provided on this After Visit Summary.  MyChart is used to connect with patients for Virtual Visits (Telemedicine).  Patients are able to view lab/test results, encounter notes, upcoming appointments, etc.  Non-urgent messages can be sent to your provider as well.   To learn more about what you can do with MyChart, go to [email or web address].    Your next appointment:   1 year(s)  The format for your next appointment:   In Person  Provider:   Candee Furbish, MD     Dr Roseanne Kaufman [facility] [address], [place]

## 2021-11-08 NOTE — Progress Notes (Signed)
Cardiology Office Note:    Date:  11/08/2021   ID:  BRODEY GILLOGLY, DOB 07-28-1955, MRN [id number]  PCP:  Mayra Neer, [facility] Cardiologist:  Candee Furbish, MD     Referring MD: Mayra Neer, MD     History of Present Illness:    John Randolph is a 66 y.o. male with a hx of CAD, hypertension, hyperlipidemia, sleep apnea, and hyperthyroidism here for follow up. He was taken to the cardiac Cath Lab 11/25/2020 which showed proximal RCA lesion 50%, proximal LAD-mid LAD 50%, mid LAD 50%, first marginal 90%, proximal circumflex 40%.  He received PCI with DES x1 to his proximal OM1.  He was placed on dual antiplatelet therapy and metoprolol.  His pravastatin was transitioned to atorvastatin and referral was placed for lipid clinic.    He most recently saw Fuller Canada, RPH-CPP on 02/07/2021 upon referral by Coletta Memos, NP. He reported persistent GI symptoms since his hospital visit 11/24/2020. These symptoms start after 3 days on atorvastatin and resolved after 2 weeks off of atorvastatin. He stated he had been experiencing shortness of breath about three times a week with activity and rest. He was advised to have caffeine with his Brilinta to see if this would help his SOB.   Per Coletta Memos, MD, note on 12/17/2020: He was admitted to the hospital on 11/25/2020 and discharged on 11/27/2020.  He reported that over the last 6 months he had been getting a chest burning type sensation after eating dinner.  The symptoms would resolve in 10 minutes after taking Tums.  He denied any indigestion type feeling with increased physical activity.  His symptoms always resolved with Tums.  In the evening he started to develop burning chest discomfort.  It was more intense than previous episodes.  He also noted left arm discomfort.  He took Tums and his symptoms did not resolve.  He denied shortness of breath, nausea, and diaphoresis.  He called 911 and was taken to the  emergency department.  Upon arrival to the ED he was hemodynamically stable.  His EKG showed diffuse ST depressions.  His troponins were elevated over thousand.  He continued to have chest discomfort in the emergency department which was largely resolved by sublingual nitroglycerin.  He was started on nitroglycerin gtt. which resolved his chest discomfort.    Today, he says he has been feeling fine.  He reports easy bruising. Sometimes just reaching into his pocket to get something will result in bruising.   Additionally, his right knee and right hip will hurt at times.  His most recent LDL was 60.   His is still playing golf and enjoying it.   He denies any palpitations, chest pain, shortness of breath, or peripheral edema. No lightheadedness, headaches, syncope, orthopnea, or PND.  Past Medical History:  Diagnosis Date   Hyperlipidemia    Hypertension    Hypothyroidism    Sleep apnea    DX years ago but never used CPAP   Thyroid disease    hypothyroidism    Past Surgical History:  Procedure Laterality Date   CATARACT EXTRACTION Bilateral 2017   LAPAROSCOPIC INGUINAL HERNIA WITH UMBILICAL HERNIA N/A [id number]   Procedure: LAPAROSCOPIC BILATERAL  INGUINAL HERNIA REPAIR WITH MESH AND OPEN UMBILICAL HERNIA REPAIR;  Surgeon: Kieth Brightly, Arta Bruce, MD;  Location: [facility];  Service: General;  Laterality: N/A;   LEFT HEART CATH AND CORONARY ANGIOGRAPHY N/A 11/26/2020   Procedure: LEFT HEART CATH AND  CORONARY ANGIOGRAPHY;  Surgeon: Martinique, Peter M, MD;  Location: [facility];  Service: Cardiovascular;  Laterality: N/A;   RETINAL DETACHMENT SURGERY  2020   THYROIDECTOMY      Current Medications: Current Meds  Medication Sig   aspirin 81 MG EC tablet Take 1 tablet (81 mg total) by mouth daily. Swallow whole.   levothyroxine (SYNTHROID, LEVOTHROID) 100 MCG tablet Take 100 mcg by mouth daily before breakfast.   LORazepam (ATIVAN) 0.5 MG tablet Take 0.5 mg by mouth at bedtime as  needed for sleep.   nicotine polacrilex (NICORETTE) 2 MG gum Take 1 each (2 mg total) by mouth as needed for smoking cessation.   nitroGLYCERIN (NITROSTAT) 0.4 MG SL tablet Place 1 tablet (0.4 mg total) under the tongue every 5 (five) minutes as needed for chest pain.   rosuvastatin (CRESTOR) 20 MG tablet Take 1 tablet (20 mg total) by mouth daily.   tadalafil (CIALIS) 5 MG tablet Take 1 tablet (5 mg total) by mouth daily as needed for erectile dysfunction. Do not take in combination with nitro. Nitro can be taken 48 hrs after cialis.   [DISCONTINUED] metoprolol succinate (TOPROL-XL) 25 MG 24 hr tablet Take 1 tablet (25 mg total) by mouth daily.   [DISCONTINUED] ticagrelor (BRILINTA) 90 MG TABS tablet Take 1 tablet (90 mg total) by mouth 2 (two) times daily.     Allergies:   Crestor [rosuvastatin], Hydrochlorothiazide, Lipitor [atorvastatin], and Zocor [simvastatin]   Social History   Socioeconomic History   Marital status: Widowed    Spouse name: Not on file   Number of children: Not on file   Years of education: Not on file   Highest education level: Not on file  Occupational History   Not on file  Tobacco Use   Smoking status: Every Day    Packs/day: 1.50    Years: 30.00    Total pack years: 45.00    Types: Cigarettes   Smokeless tobacco: Never  Vaping Use   Vaping Use: Never used  Substance and Sexual Activity   Alcohol use: Yes    Comment: occassionally   Drug use: No   Sexual activity: Not on file  Other Topics Concern   Not on file  Social History Narrative   Not on file   Social Determinants of Health   Financial Resource Strain: Not on file  Food Insecurity: Not on file  Transportation Needs: Not on file  Physical Activity: Not on file  Stress: Not on file  Social Connections: Not on file     Family History: The patient's family history is not on file.  ROS:   Please see the history of present illness.    (+) Easy bruising (+) Right knee pain (+)  Right hip pain  All other systems reviewed and are negative.  EKGs/Labs/Other Studies Reviewed:    The following studies were reviewed today:  Left Heart Cath 11/26/2020: Prox RCA lesion is 50% stenosed.   Prox LAD to Mid LAD lesion is 50% stenosed.   Mid LAD lesion is 50% stenosed.   1st Mrg lesion is 90% stenosed.   Prox Cx lesion is 40% stenosed.   A stent was successfully placed.   A drug-eluting stent was successfully placed using a SYNERGY XD [id number].   Post intervention, there is a 0% residual stenosis.   Post intervention, there is a 0% residual stenosis.   LV end diastolic pressure is normal.   Single vessel obstructive CAD with culprit lesion  in the proximal OM1. Moderate nonobstructive disease in the LAD and RCA Normal LVEDP Successful PCI of the LCx/OM1 with DES x 1   Plan: DAPT for one year. Aggressive risk factor modification. Anticipate DC in am.   Echo 11/26/2020 1. Left ventricular ejection fraction, by estimation, is 50 to 55%. The  left ventricle has low normal function. The left ventricle has no regional  wall motion abnormalities. Left ventricular diastolic parameters are  consistent with Grade I diastolic  dysfunction (impaired relaxation).   2. Right ventricular systolic function is mildly reduced. The right  ventricular size is normal. There is normal pulmonary artery systolic  pressure.   3. The mitral valve is normal in structure. No evidence of mitral valve  regurgitation. No evidence of mitral stenosis.   4. The aortic valve is normal in structure. Aortic valve regurgitation is  not visualized. Aortic valve sclerosis is present, with no evidence of  aortic valve stenosis.   5. The inferior vena cava is normal in size with greater than 50%  respiratory variability, suggesting right atrial pressure of 3 mmHg.    EKG:  EKG is personally reviewed. 11/08/21: EKG was not ordered.   Recent Labs: 11/25/2020: B Natriuretic Peptide 76.3; TSH  1.676 11/27/2020: BUN 10; Creatinine, Ser 0.93; Hemoglobin 14.9; Magnesium 1.9; Platelets 225; Potassium 3.7; Sodium 133 03/13/2021: ALT 11  Recent Lipid Panel    Component Value Date/Time   CHOL 116 03/13/2021 0849   TRIG 79 03/13/2021 0849   HDL 40 03/13/2021 0849   CHOLHDL 2.9 03/13/2021 0849   CHOLHDL 6.2 11/24/2020 2352   VLDL 49 (H) 11/24/2020 2352   LDLCALC 60 03/13/2021 0849     Risk Assessment/Calculations:               Physical Exam:    VS:  BP 110/60 (BP Location: Left Arm, Patient Position: Sitting, Cuff Size: Normal)   Pulse 64   Ht 5\' 8"  (1.727 m)   Wt 166 lb (75.3 kg)   SpO2 94%   BMI 25.24 kg/m     Wt Readings from Last 3 Encounters:  11/08/21 166 lb (75.3 kg)  12/19/20 169 lb (76.7 kg)  12/17/20 169 lb (76.7 kg)     GEN: Well nourished, well developed in no acute distress HEENT: Normal NECK: No JVD; No carotid bruits LYMPHATICS: No lymphadenopathy CARDIAC: RRR, no murmurs, rubs, gallops RESPIRATORY:  Clear to auscultation without rales, wheezing or rhonchi  ABDOMEN: Soft, non-tender, non-distended MUSCULOSKELETAL:  No edema; No deformity  SKIN: Warm and dry NEUROLOGIC:  Alert and oriented x 3 PSYCHIATRIC:  Normal affect   ASSESSMENT:    1. Coronary artery disease involving native coronary artery of native heart with angina pectoris ([place])   2. Tobacco use   3. Mixed hyperlipidemia     PLAN:    In order of problems listed above:  Coronary artery disease - PCI to obtuse marginal 1.  Showed him cardiac catheterization.  It has been approximately 1 year.  We will go ahead and stop his Brilinta.  Easy bruising.  Continue with aspirin 81 mg lifelong. -We will taper off his Toprol 25 mg. -No anginal symptoms.  EF 55%  Hyperlipidemia - Doing well on rosuvastatin 20 mg.  Excellent LDL.  Tobacco use - Cessation.        Follow up: 1 year.  Medication Adjustments/Labs and Tests Ordered: Current medicines are reviewed at length with  the patient today.  Concerns regarding medicines are outlined above.  No orders of the defined types were placed in this encounter.  No orders of the defined types were placed in this encounter.   Patient Instructions  Medication Instructions:  Please discontinue your Brilinta. Decrease Metoprolol to 1/2 tablet daily for 3 to 4 days then discontinue. Continue all other medications as listed.  *If you need a refill on your cardiac medications before your next appointment, please call your pharmacy*  Follow-Up: At [facility], you and your health needs are our priority.  As part of our continuing mission to provide you with exceptional heart care, we have created designated Provider Care Teams.  These Care Teams include your primary Cardiologist (physician) and Advanced Practice Providers (APPs -  Physician Assistants and Nurse Practitioners) who all work together to provide you with the care you need, when you need it.  We recommend signing up for the patient portal called "MyChart".  Sign up information is provided on this After Visit Summary.  MyChart is used to connect with patients for Virtual Visits (Telemedicine).  Patients are able to view lab/test results, encounter notes, upcoming appointments, etc.  Non-urgent messages can be sent to your provider as well.   To learn more about what you can do with MyChart, go to [email or web address].    Your next appointment:   1 year(s)  The format for your next appointment:   In Person  Provider:   Candee Furbish, MD     Dr Roseanne Kaufman [facility] [address], [place], [place] as a scribe for Candee Furbish, MD.,have documented all relevant documentation on the behalf of Candee Furbish, MD,as directed by  Candee Furbish, MD while in the presence of Candee Furbish, MD.  I, Candee Furbish, MD, have reviewed all documentation for this  visit. The documentation on 11/08/21 for the exam, diagnosis, procedures, and orders are all accurate and complete.    Signed, Candee Furbish, MD  11/08/2021 10:33 AM    [place]

## 2021-12-04 DIAGNOSIS — M79645 Pain in left finger(s): Secondary | ICD-10-CM | POA: Diagnosis not present

## 2021-12-04 DIAGNOSIS — M25642 Stiffness of left hand, not elsewhere classified: Secondary | ICD-10-CM | POA: Diagnosis not present

## 2021-12-04 DIAGNOSIS — M20012 Mallet finger of left finger(s): Secondary | ICD-10-CM | POA: Diagnosis not present

## 2022-01-31 DIAGNOSIS — M20012 Mallet finger of left finger(s): Secondary | ICD-10-CM | POA: Diagnosis not present

## 2022-02-17 DIAGNOSIS — M25642 Stiffness of left hand, not elsewhere classified: Secondary | ICD-10-CM | POA: Diagnosis not present

## 2022-02-17 DIAGNOSIS — M20012 Mallet finger of left finger(s): Secondary | ICD-10-CM | POA: Diagnosis not present

## 2022-02-18 ENCOUNTER — Other Ambulatory Visit: Payer: Self-pay | Admitting: Cardiology

## 2022-03-22 ENCOUNTER — Other Ambulatory Visit: Payer: Self-pay | Admitting: Acute Care

## 2022-03-22 DIAGNOSIS — Z87891 Personal history of nicotine dependence: Secondary | ICD-10-CM

## 2022-03-22 DIAGNOSIS — F17210 Nicotine dependence, cigarettes, uncomplicated: Secondary | ICD-10-CM

## 2022-03-22 DIAGNOSIS — Z122 Encounter for screening for malignant neoplasm of respiratory organs: Secondary | ICD-10-CM

## 2022-04-03 DIAGNOSIS — H40053 Ocular hypertension, bilateral: Secondary | ICD-10-CM | POA: Diagnosis not present

## 2022-05-06 DIAGNOSIS — Z0000 Encounter for general adult medical examination without abnormal findings: Secondary | ICD-10-CM | POA: Diagnosis not present

## 2022-05-06 DIAGNOSIS — Z125 Encounter for screening for malignant neoplasm of prostate: Secondary | ICD-10-CM | POA: Diagnosis not present

## 2022-05-06 DIAGNOSIS — N400 Enlarged prostate without lower urinary tract symptoms: Secondary | ICD-10-CM | POA: Diagnosis not present

## 2022-05-06 DIAGNOSIS — G4733 Obstructive sleep apnea (adult) (pediatric): Secondary | ICD-10-CM | POA: Diagnosis not present

## 2022-05-06 DIAGNOSIS — E039 Hypothyroidism, unspecified: Secondary | ICD-10-CM | POA: Diagnosis not present

## 2022-05-06 DIAGNOSIS — Z720 Tobacco use: Secondary | ICD-10-CM | POA: Diagnosis not present

## 2022-05-06 DIAGNOSIS — I10 Essential (primary) hypertension: Secondary | ICD-10-CM | POA: Diagnosis not present

## 2022-05-06 DIAGNOSIS — I700 Atherosclerosis of aorta: Secondary | ICD-10-CM | POA: Diagnosis not present

## 2022-05-06 DIAGNOSIS — J439 Emphysema, unspecified: Secondary | ICD-10-CM | POA: Diagnosis not present

## 2022-05-06 DIAGNOSIS — N529 Male erectile dysfunction, unspecified: Secondary | ICD-10-CM | POA: Diagnosis not present

## 2022-05-06 DIAGNOSIS — E782 Mixed hyperlipidemia: Secondary | ICD-10-CM | POA: Diagnosis not present

## 2022-05-06 DIAGNOSIS — I25118 Atherosclerotic heart disease of native coronary artery with other forms of angina pectoris: Secondary | ICD-10-CM | POA: Diagnosis not present

## 2022-05-06 LAB — LAB REPORT - SCANNED: EGFR: 90

## 2022-05-12 ENCOUNTER — Other Ambulatory Visit: Payer: BC Managed Care – PPO

## 2022-05-13 ENCOUNTER — Encounter: Payer: Self-pay | Admitting: Family Medicine

## 2022-05-21 ENCOUNTER — Other Ambulatory Visit: Payer: Self-pay | Admitting: Cardiology

## 2022-06-11 ENCOUNTER — Encounter: Payer: Self-pay | Admitting: Acute Care

## 2022-06-12 ENCOUNTER — Inpatient Hospital Stay: Admission: RE | Admit: 2022-06-12 | Payer: Self-pay | Source: Ambulatory Visit

## 2022-06-18 ENCOUNTER — Telehealth: Payer: Self-pay | Admitting: Acute Care

## 2022-06-18 NOTE — Telephone Encounter (Signed)
Called and left message for pt. Pt's LDCT needs rescheduling due to not being authorized with insurance at the time of the scan.

## 2022-06-20 NOTE — Telephone Encounter (Signed)
Patient has been re-scheduled for LDCT 6/26. Nothing further needed.

## 2022-07-09 ENCOUNTER — Ambulatory Visit
Admission: RE | Admit: 2022-07-09 | Discharge: 2022-07-09 | Disposition: A | Discharge status: Home / Self Care | Payer: 59 | Source: Ambulatory Visit | Attending: Acute Care | Admitting: Acute Care

## 2022-07-09 DIAGNOSIS — Z122 Encounter for screening for malignant neoplasm of respiratory organs: Secondary | ICD-10-CM

## 2022-07-09 DIAGNOSIS — Z87891 Personal history of nicotine dependence: Secondary | ICD-10-CM

## 2022-07-09 DIAGNOSIS — F17210 Nicotine dependence, cigarettes, uncomplicated: Secondary | ICD-10-CM

## 2022-08-15 IMAGING — CT CT CHEST LUNG CANCER SCREENING LOW DOSE W/O CM
2 of 4 series · 15 of 36 positions shown, 18 images · non-contrast
Comparison: CT chest dated 10/21/2013

CLINICAL DATA: 64-year-old male current smoker, with 60 pack-year
history of smoking, for initial lung cancer screening

EXAM:
CT CHEST WITHOUT CONTRAST LOW-DOSE FOR LUNG CANCER SCREENING
TECHNIQUE: Multidetector CT imaging of the chest was performed following the
standard protocol without IV contrast.

[Series 3: lung thins 1.0 · axial · 0.75mm/px · z∈[-341,-43]mm · 12 of 328 slices shown, 15 images]
[im 15/328  mediastinal]
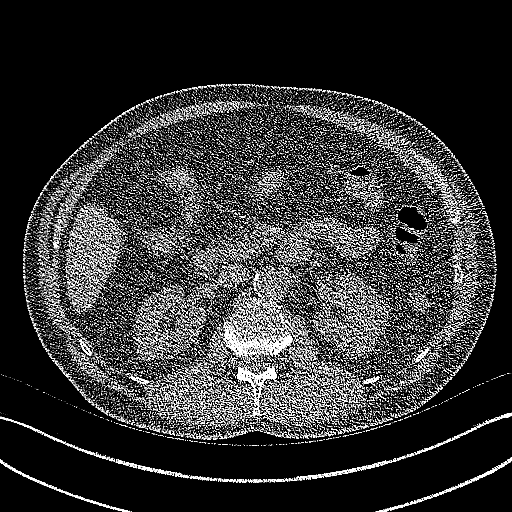
[im 15/328  lung]
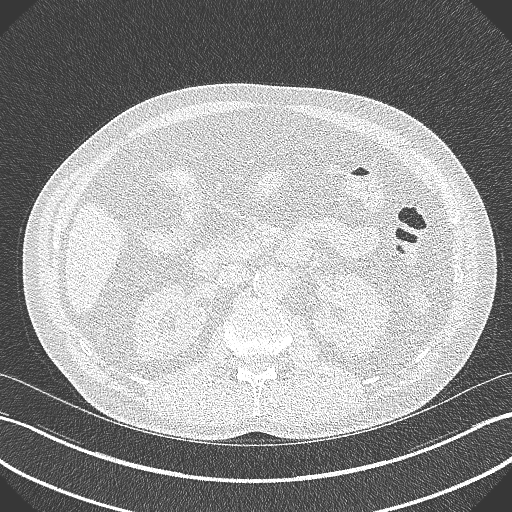
[im 45/328  lung]
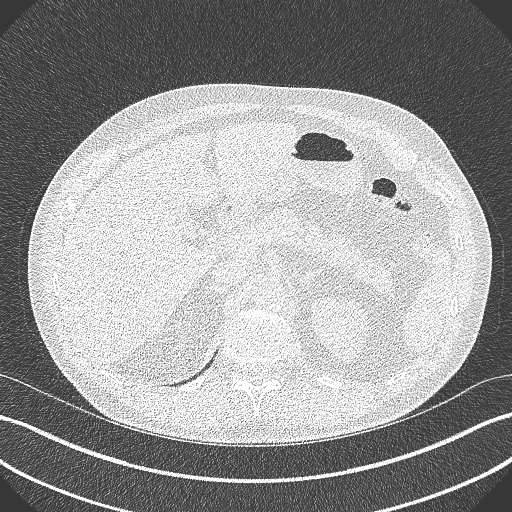
[im 75/328  lung]
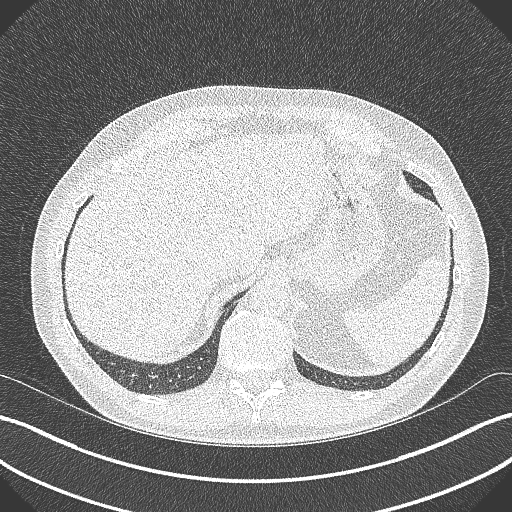
[im 105/328  lung]
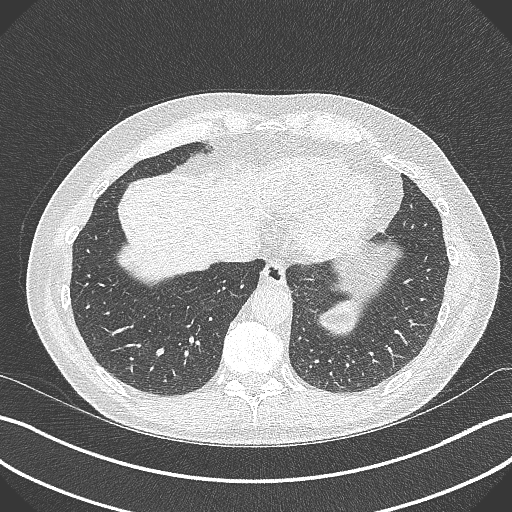
[im 119/328  mediastinal]
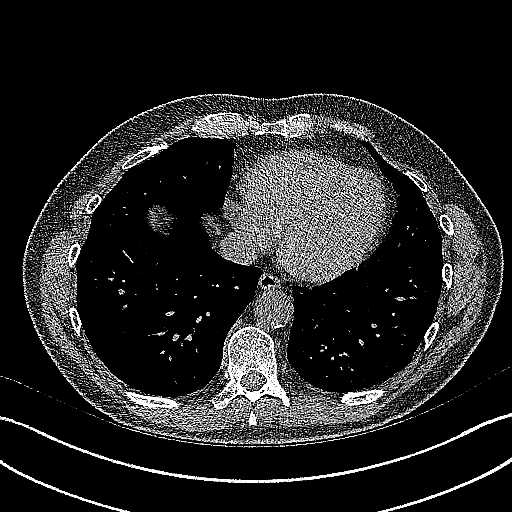
[im 119/328  lung]
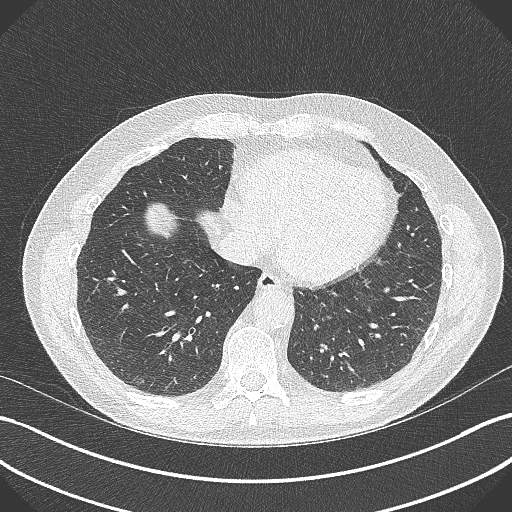
[im 149/328  lung]
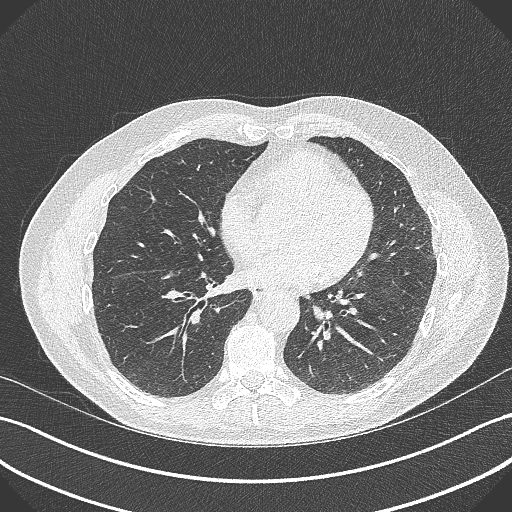
[im 179/328  lung]
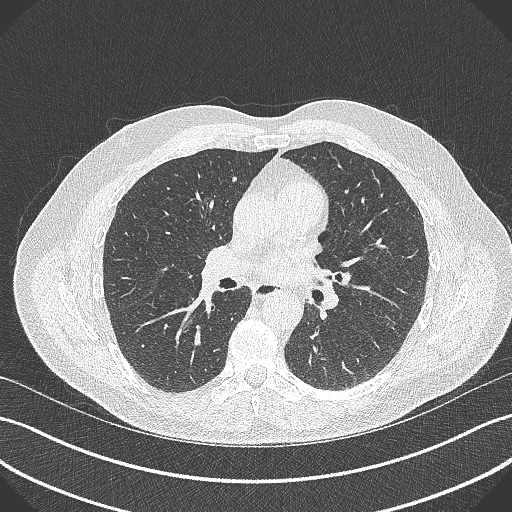
[im 209/328  lung]
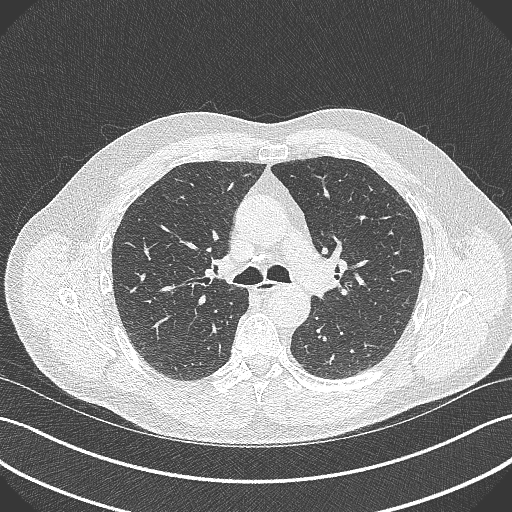
[im 223/328  mediastinal]
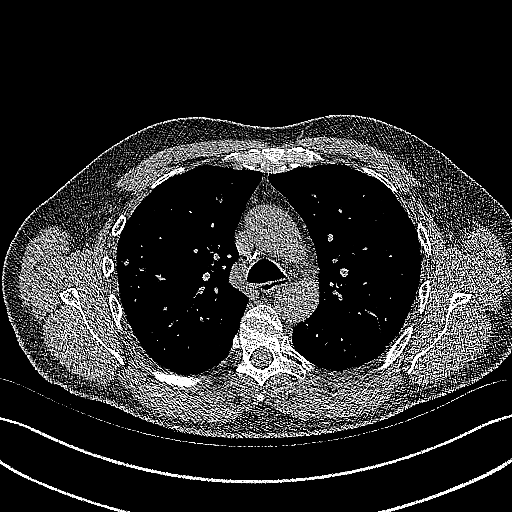
[im 223/328  lung]
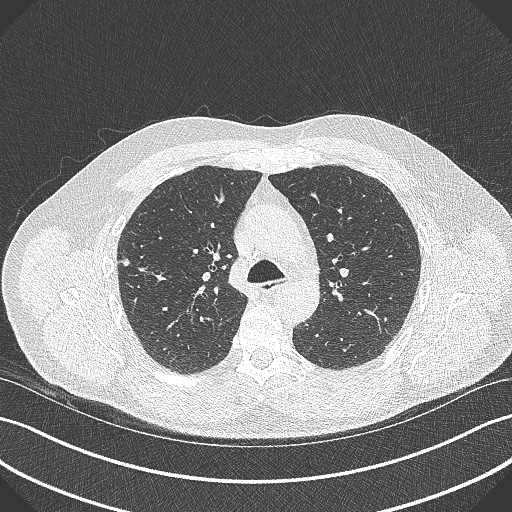
[im 253/328  lung]
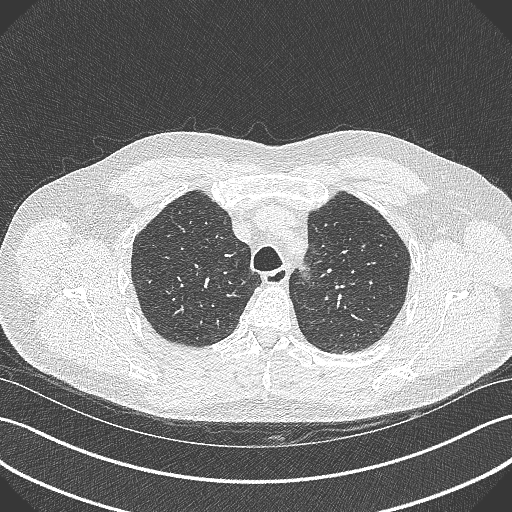
[im 283/328  lung]
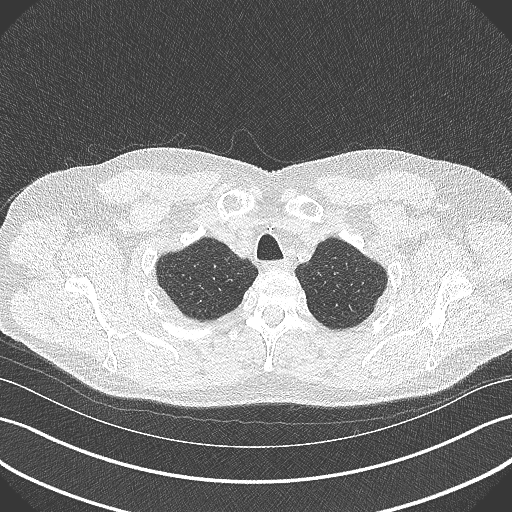
[im 313/328  lung]
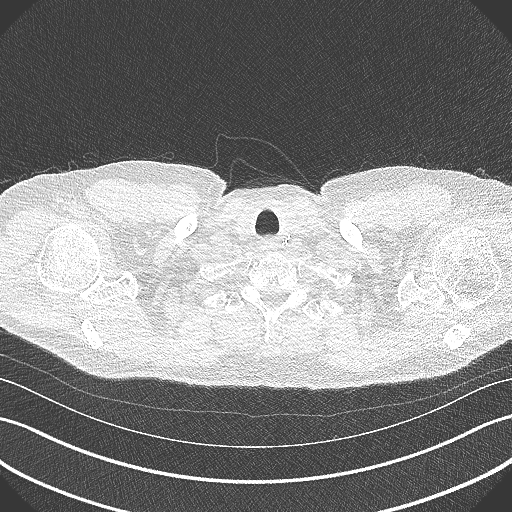

[Series 5: coronal · coronal · 0.63mm/px · 3 of 120 slices shown]
[im 24/120  lung]
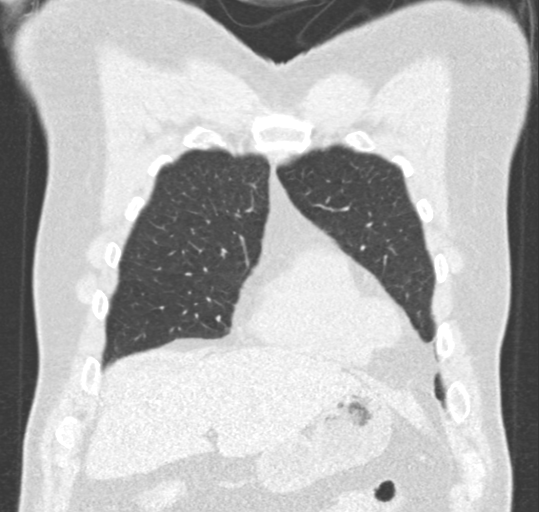
[im 48/120  lung]
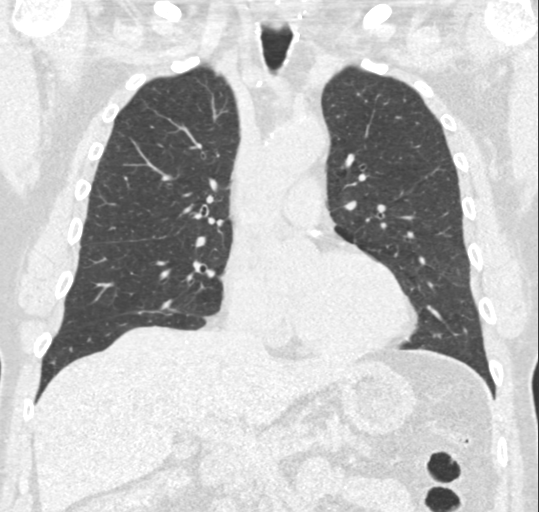
[im 72/120  lung]
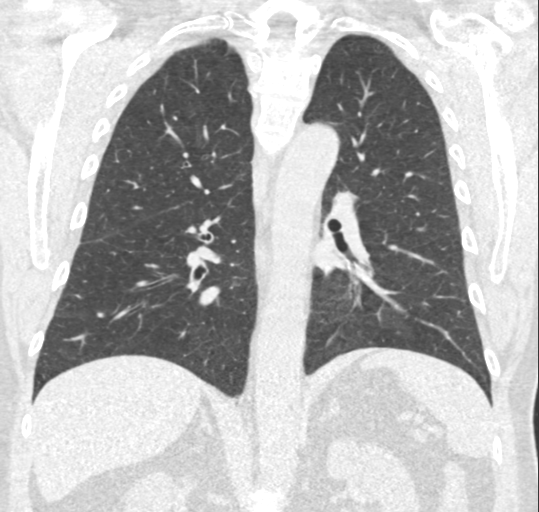

[15 of 36 positions shown; findings below may reference images not displayed]

FINDINGS: Cardiovascular: Heart is normal in size.  No pericardial effusion.

No evidence of thoracic aortic aneurysm. Mild atherosclerotic a
shins the aortic arch.

Coronary atherosclerosis the LAD and left circumflex.

Mediastinum/Nodes: Calcified mediastinal and right perihilar nodes,
benign.

Status post thyroidectomy.

Lungs/Pleura: Mild centrilobular emphysematous changes, upper lung
predominant.

No focal consolidation.

6.1 mm calcified granuloma in the right upper lobe, benign.

No pleural effusion or pneumothorax.

Upper Abdomen: Visualized upper abdomen is notable for calcified
hepatic and splenic granulomata and vascular calcifications.

Musculoskeletal: Visualized osseous structures are within normal
limits.
IMPRESSION: Lung-RADS 1, negative. Continue annual screening with low-dose chest
CT without contrast in 12 months.

Aortic Atherosclerosis (V23UN-0KV.V) and Emphysema (V23UN-JJ4.Y).

## 2022-11-01 ENCOUNTER — Other Ambulatory Visit: Payer: Self-pay | Admitting: Cardiology

## 2022-11-29 DIAGNOSIS — R69 Illness, unspecified: Secondary | ICD-10-CM | POA: Diagnosis not present

## 2022-12-03 ENCOUNTER — Other Ambulatory Visit: Payer: Self-pay | Admitting: Cardiology

## 2022-12-08 DIAGNOSIS — R69 Illness, unspecified: Secondary | ICD-10-CM | POA: Diagnosis not present

## 2023-01-10 DIAGNOSIS — R69 Illness, unspecified: Secondary | ICD-10-CM | POA: Diagnosis not present

## 2023-01-13 DIAGNOSIS — R69 Illness, unspecified: Secondary | ICD-10-CM | POA: Diagnosis not present

## 2023-03-03 DIAGNOSIS — H524 Presbyopia: Secondary | ICD-10-CM | POA: Diagnosis not present

## 2023-05-07 ENCOUNTER — Encounter: Payer: Self-pay | Admitting: *Deleted

## 2023-05-11 DIAGNOSIS — I10 Essential (primary) hypertension: Secondary | ICD-10-CM | POA: Diagnosis not present

## 2023-05-11 DIAGNOSIS — E039 Hypothyroidism, unspecified: Secondary | ICD-10-CM | POA: Diagnosis not present

## 2023-05-11 DIAGNOSIS — Z125 Encounter for screening for malignant neoplasm of prostate: Secondary | ICD-10-CM | POA: Diagnosis not present

## 2023-05-11 DIAGNOSIS — Z1159 Encounter for screening for other viral diseases: Secondary | ICD-10-CM | POA: Diagnosis not present

## 2023-05-11 DIAGNOSIS — E782 Mixed hyperlipidemia: Secondary | ICD-10-CM | POA: Diagnosis not present

## 2023-05-11 LAB — LAB REPORT - SCANNED
Albumin, Urine POC: 0.71
Creatinine, POC: 228 mg/dL
EGFR: 96
Microalb Creat Ratio: 3.1

## 2023-05-12 ENCOUNTER — Other Ambulatory Visit: Payer: Self-pay

## 2023-05-12 DIAGNOSIS — F17210 Nicotine dependence, cigarettes, uncomplicated: Secondary | ICD-10-CM

## 2023-05-12 DIAGNOSIS — Z87891 Personal history of nicotine dependence: Secondary | ICD-10-CM

## 2023-05-12 DIAGNOSIS — Z122 Encounter for screening for malignant neoplasm of respiratory organs: Secondary | ICD-10-CM

## 2023-05-19 ENCOUNTER — Ambulatory Visit
Admission: RE | Admit: 2023-05-19 | Discharge: 2023-05-19 | Disposition: A | Discharge status: Home / Self Care | Source: Ambulatory Visit | Attending: Acute Care | Admitting: Acute Care

## 2023-05-19 DIAGNOSIS — F17210 Nicotine dependence, cigarettes, uncomplicated: Secondary | ICD-10-CM | POA: Diagnosis not present

## 2023-05-19 DIAGNOSIS — Z122 Encounter for screening for malignant neoplasm of respiratory organs: Secondary | ICD-10-CM | POA: Diagnosis not present

## 2023-05-19 DIAGNOSIS — Z87891 Personal history of nicotine dependence: Secondary | ICD-10-CM

## 2023-06-12 DIAGNOSIS — Z87891 Personal history of nicotine dependence: Secondary | ICD-10-CM | POA: Diagnosis not present

## 2023-06-12 DIAGNOSIS — Z136 Encounter for screening for cardiovascular disorders: Secondary | ICD-10-CM | POA: Diagnosis not present

## 2023-06-15 ENCOUNTER — Other Ambulatory Visit: Payer: Self-pay

## 2023-06-15 DIAGNOSIS — Z122 Encounter for screening for malignant neoplasm of respiratory organs: Secondary | ICD-10-CM

## 2023-06-15 DIAGNOSIS — Z87891 Personal history of nicotine dependence: Secondary | ICD-10-CM

## 2023-06-15 DIAGNOSIS — F17210 Nicotine dependence, cigarettes, uncomplicated: Secondary | ICD-10-CM

## 2023-07-06 IMAGING — CR DG CHEST 2V
2 series · 2 of 2 positions shown · non-contrast
Comparison: Chest CT 01/04/2020

CLINICAL DATA: Chest pain.  Shortness of breath.

EXAM:
CHEST - 2 VIEW

[chest pa]
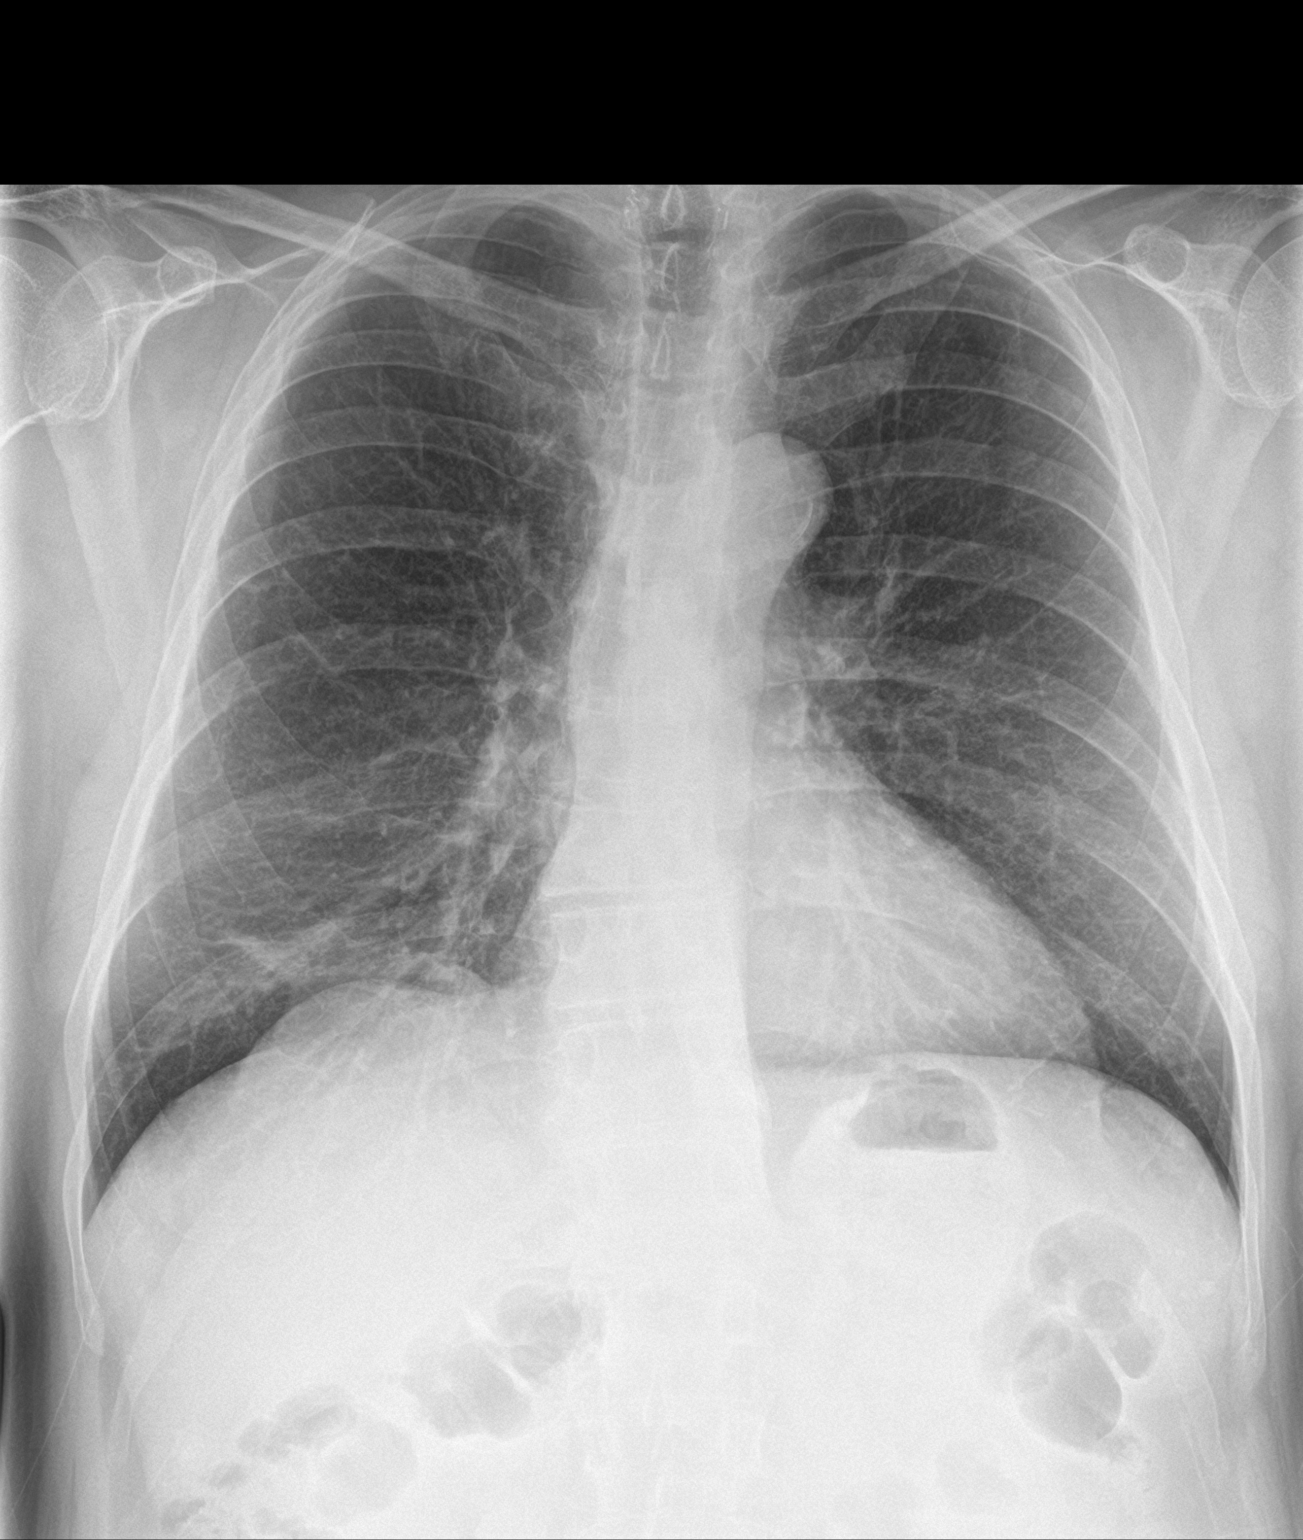

[chest lat]
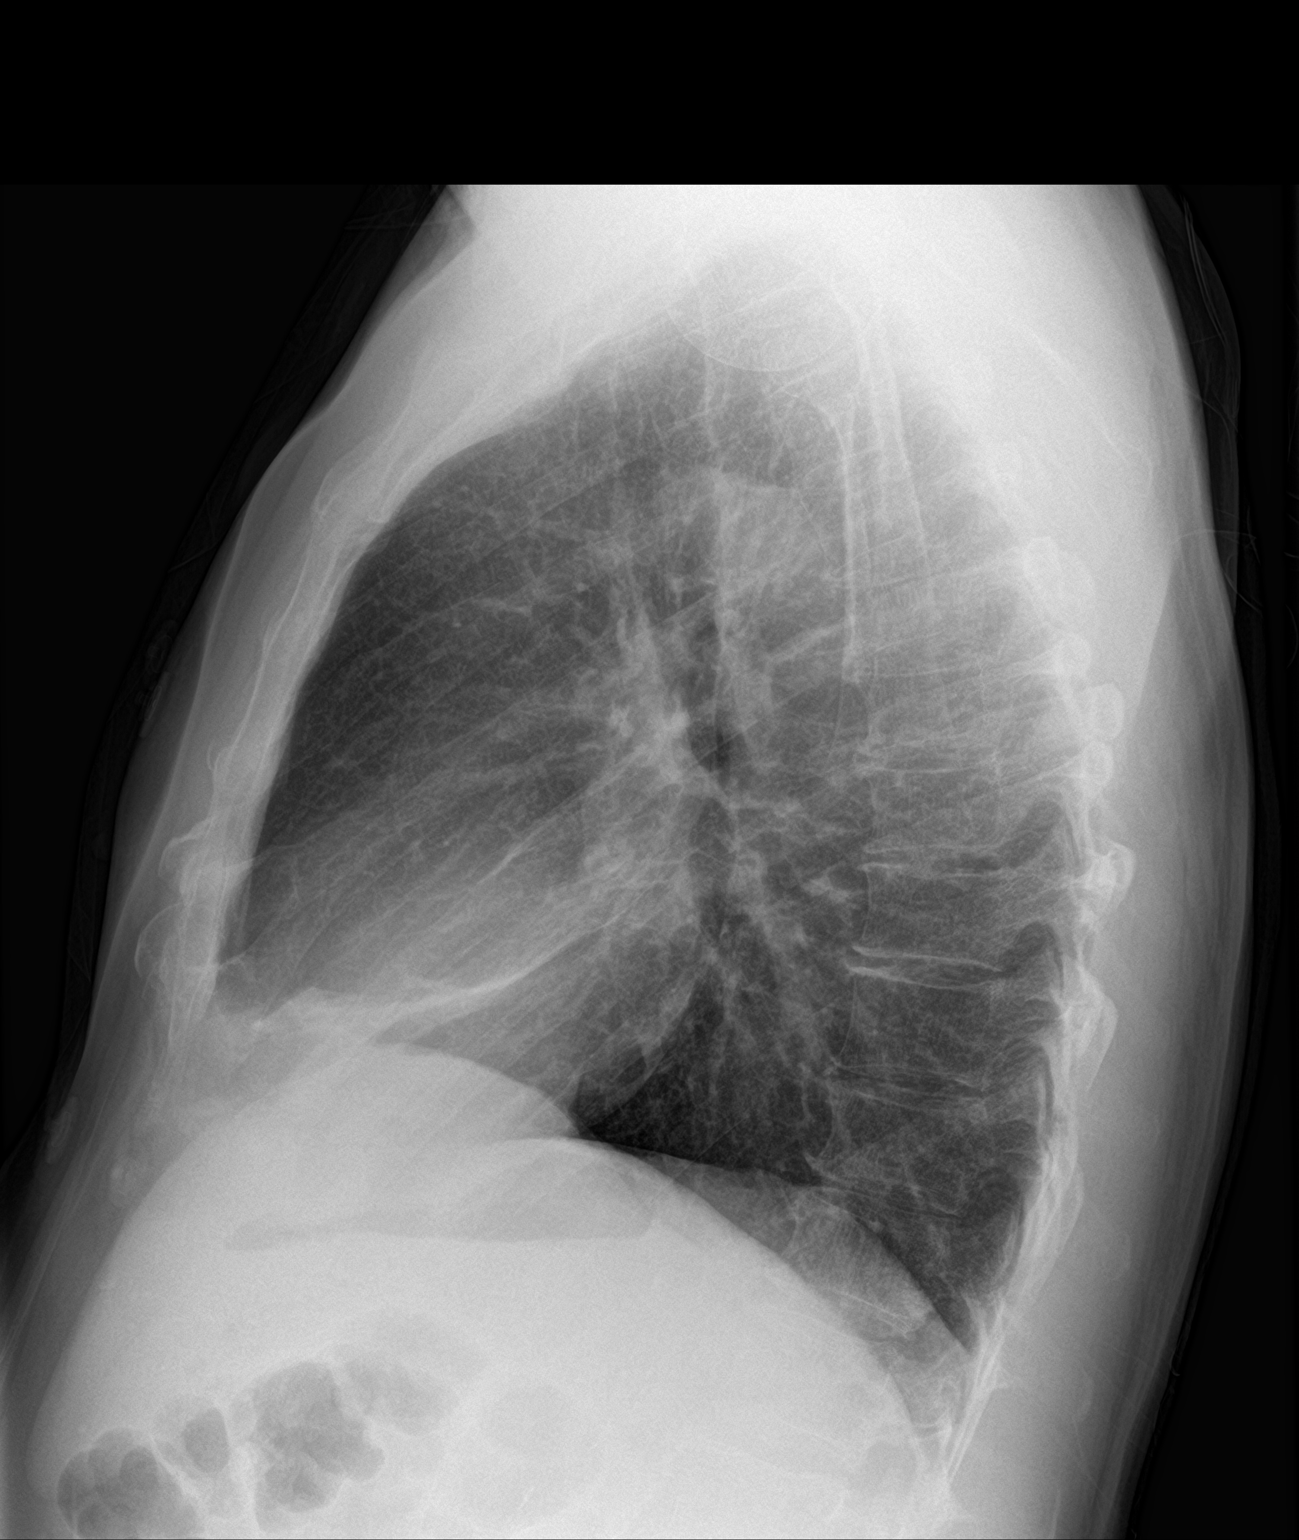

[2 of 2 positions shown; findings below may reference images not displayed]

FINDINGS: The heart is normal in size. Aortic atherosclerosis and tortuosity.
Streaky bandlike opacity in the right middle lobe. Mild bronchial
thickening. Calcified granuloma in the right mid lung zone. No
pulmonary edema, pleural effusion, or pneumothorax. No acute osseous
abnormalities are seen.
IMPRESSION: 1. Streaky bandlike right middle lobe opacity, favor atelectasis.
2. Mild bronchial thickening, chronic.

## 2023-07-07 DIAGNOSIS — H40053 Ocular hypertension, bilateral: Secondary | ICD-10-CM | POA: Diagnosis not present

## 2023-10-13 ENCOUNTER — Ambulatory Visit: Admitting: Cardiology

## 2023-12-03 ENCOUNTER — Ambulatory Visit: Attending: Cardiology | Admitting: Cardiology

## 2023-12-03 ENCOUNTER — Encounter: Payer: Self-pay | Admitting: Cardiology

## 2023-12-03 VITALS — BP 155/88 | HR 68 | Ht 68.0 in | Wt 165.0 lb

## 2023-12-03 DIAGNOSIS — I25119 Atherosclerotic heart disease of native coronary artery with unspecified angina pectoris: Secondary | ICD-10-CM | POA: Diagnosis not present

## 2023-12-03 DIAGNOSIS — E782 Mixed hyperlipidemia: Secondary | ICD-10-CM

## 2023-12-03 DIAGNOSIS — R079 Chest pain, unspecified: Secondary | ICD-10-CM

## 2023-12-03 NOTE — Patient Instructions (Addendum)
 Medication Instructions:  The current medical regimen is effective;  continue present plan and medications.  *If you need a refill on your cardiac medications before your next appointment, please call your pharmacy*   You have been referred to the Lipid Clinic here on the 2nd floor.   Testing/Procedures: You are scheduled for a Lexiscan Myocardial Perfusion Imaging Study on       at       .   Please arrive 15 minutes prior to your appointment time for registration and insurance purposes.   The test will take approximately 3 to 4 hours to complete; you may bring reading material. If someone comes with you to your appointment, they will need to remain in the main lobby due to limited space in the testing area.   If you are pregnant or breastfeeding, please notify the nuclear lab prior to your appointment.   How to prepare for your Lexiscan Myocardial Perfusion test:   Do not eat or drink 3 hours prior to your test, except you may have water.    Do not consume products containing caffeine (regular or decaffeinated) 12 hours prior to your test (ex: coffee, chocolate, soda, tea)   Do bring a list of your current medications with you. If not listed below, you may take your medications as normal.   Bring any held medication to your appointment, as you may be required to take it once the test is complete.   Do wear comfortable clothes (no dresses or overalls) and walking shoes. Tennis shoes are preferred. No heels or open toed shoes.  Do not wear cologne, perfume, aftershave or lotions (deodorant is allowed).   If these instructions are not followed, you test will have to be rescheduled.   Please report to [address] [place], [place] for your test. If you have questions or concerns about your appointment, please call the Nuclear Lab at #[phone].  If you cannot keep your appointment, please provide 24 hour notification to the Nuclear lab to avoid a possible $50 charge to your  account.   Follow-Up: At [facility], you and your health needs are our priority.  As part of our continuing mission to provide you with exceptional heart care, our providers are all part of one team.  This team includes your primary Cardiologist (physician) and Advanced Practice Providers or APPs (Physician Assistants and Nurse Practitioners) who all work together to provide you with the care you need, when you need it.  Your next appointment:   1 year(s)  Provider:   Oneil Parchment, MD    We recommend signing up for the patient portal called MyChart.  Sign up information is provided on this After Visit Summary.  MyChart is used to connect with patients for Virtual Visits (Telemedicine).  Patients are able to view lab/test results, encounter notes, upcoming appointments, etc.  Non-urgent messages can be sent to your provider as well.   To learn more about what you can do with MyChart, go to [email or web address].

## 2023-12-03 NOTE — Progress Notes (Signed)
 Cardiology Office Note:  .   Date:  12/03/2023  ID:  John Randolph, DOB 12-11-1955, MRN [id number] PCP: Loreli Kins, MD  [facility] Providers Cardiologist:  Oneil Parchment, MD     History of Present Illness: .   John Randolph is a 68 y.o. male Discussed the use of AI scribe  History of Present Illness John Randolph is a 69 year old male with coronary artery disease who presents for a one year follow-up.  He has a history of coronary artery disease with a prior percutaneous coronary intervention (PCI) to the obtuse marginal one (OM1) artery. He underwent a successful PCI of the left circumflex OM1 on November 26, 2020. Currently, he is asymptomatic with no angina and has an ejection fraction of 55%.  He is on aspirin  81 mg daily. He was previously on rosuvastatin  20 mg for hyperlipidemia but discontinued it due to gastrointestinal side effects, specifically diarrhea. He has since been switched to ezetimibe, which he tolerates better. Higher doses of rosuvastatin  previously affected his appetite, leading to a lack of hunger.  He experiences heartburn, particularly after consuming foods like raw onions, but denies any chest pain or unusual symptoms. He recalls mistaking a previous cardiac event for heartburn, which led him to drive himself to the hospital.  He currently uses cigarettes with the lowest tar and nicotine  content. He lives near [facility] in [place] the [place] and has a child who is a emergency planning/management officer in [place]      Studies Reviewed: .        Results DIAGNOSTIC Heart catheterization: Successful percutaneous coronary intervention (PCI) of the left circumflex obtuse marginal branch 1 (OM1) (11/26/2020) Risk Assessment/Calculations:           Physical Exam:   VS:  BP (!) 155/88   Pulse 68   Ht 5' 8 (1.727 m)   Wt 165 lb (74.8 kg)   SpO2 97%   BMI 25.09 kg/m    Wt Readings from Last 3 Encounters:  12/03/23 165 lb (74.8 kg)   11/08/21 166 lb (75.3 kg)  12/19/20 169 lb (76.7 kg)    GEN: Well nourished, well developed in no acute distress NECK: No JVD; No carotid bruits CARDIAC: RRR, no murmurs, no rubs, no gallops RESPIRATORY:  Clear to auscultation without rales, wheezing or rhonchi  ABDOMEN: Soft, non-tender, non-distended EXTREMITIES:  No edema; No deformity   ASSESSMENT AND PLAN: .    Assessment and Plan Assessment & Plan Coronary artery disease, post-PCI, without angina Coronary artery disease status post successful PCI of the left circumflex OM1 in November 2022. No current angina symptoms. Ejection fraction is 55%. Reports heartburn with certain foods, but no chest pain or unusual symptoms suggestive of cardiac ischemia. - Ordered stress test to evaluate cardiac function and ensure adequate blood flow to the heart.  Hyperlipidemia with statin intolerance Hyperlipidemia with intolerance to rosuvastatin  and ezetimibe. Previous statin therapy caused diarrhea and appetite suppression.  He has tried several statins in the past.  LDL goal is less than 70, ideally less than 55 for high-risk patients. Discussed Repatha (evolocumab) as a potential treatment option, which is administered as a biweekly injection and has shown significant LDL reduction, thereby lowering cardiovascular risk. - Consulted pharmacy team to initiate Repatha (evolocumab) for cholesterol management.  Tobacco use disorder Continued tobacco use despite counseling on cessation. Currently using low tar and nicotine  cigarettes. Acknowledges the need to quit smoking. - Continue counseling on smoking cessation.  Informed Consent   Shared Decision Making/Informed Consent The risks [chest pain, shortness of breath, cardiac arrhythmias, dizziness, blood pressure fluctuations, myocardial infarction, stroke/transient ischemic attack, nausea, vomiting, allergic reaction, radiation exposure, metallic taste sensation and life-threatening  complications (estimated to be 1 in 10,000)], benefits (risk stratification, diagnosing coronary artery disease, treatment guidance) and alternatives of a nuclear stress test were discussed in detail with Mr. Mcnairy and he agrees to proceed.     Dispo: 1 yr  Signed, Oneil Parchment, MD

## 2023-12-09 ENCOUNTER — Other Ambulatory Visit: Payer: Self-pay | Admitting: Cardiology

## 2023-12-09 ENCOUNTER — Telehealth (HOSPITAL_COMMUNITY): Payer: Self-pay | Admitting: *Deleted

## 2023-12-09 DIAGNOSIS — R079 Chest pain, unspecified: Secondary | ICD-10-CM

## 2023-12-09 DIAGNOSIS — I25119 Atherosclerotic heart disease of native coronary artery with unspecified angina pectoris: Secondary | ICD-10-CM

## 2023-12-09 NOTE — Telephone Encounter (Signed)
 Patient given detailed instructions per Myocardial Perfusion Study Information Sheet for the test on 12/14/2023 at 10:45. Patient notified to arrive 15 minutes early and that it is imperative to arrive on time for appointment to keep from having the test rescheduled.  If you need to cancel or reschedule your appointment, please call the office within 24 hours of your appointment. . Patient verbalized understanding.John Randolph

## 2023-12-14 ENCOUNTER — Ambulatory Visit (HOSPITAL_COMMUNITY)
Admission: RE | Admit: 2023-12-14 | Discharge: 2023-12-14 | Disposition: A | Source: Ambulatory Visit | Attending: Cardiology | Admitting: Cardiology

## 2023-12-14 DIAGNOSIS — I25119 Atherosclerotic heart disease of native coronary artery with unspecified angina pectoris: Secondary | ICD-10-CM | POA: Insufficient documentation

## 2023-12-14 DIAGNOSIS — R079 Chest pain, unspecified: Secondary | ICD-10-CM | POA: Diagnosis not present

## 2023-12-14 LAB — MYOCARDIAL PERFUSION IMAGING
LV dias vol: 103 mL (ref 62–150)
LV sys vol: 39 mL (ref 4.2–5.8)
Nuc Stress EF: 62 %
Peak HR: 86 {beats}/min
Rest HR: 62 {beats}/min
Rest Nuclear Isotope Dose: 10.4 mCi
SDS: 0
SRS: 3
SSS: 1
ST Depression (mm): 0 mm
Stress Nuclear Isotope Dose: 32.5 mCi
TID: 1

## 2023-12-14 MED ORDER — REGADENOSON 0.4 MG/5ML IV SOLN
INTRAVENOUS | Status: AC
  Filled 2023-12-14: qty 5

## 2023-12-14 MED ORDER — TECHNETIUM TC 99M TETROFOSMIN IV KIT
32.5000 | PACK | Freq: Once | INTRAVENOUS | Status: AC | PRN
  Administered 2023-12-14: 32.5 via INTRAVENOUS

## 2023-12-14 MED ORDER — TECHNETIUM TC 99M TETROFOSMIN IV KIT
10.4000 | PACK | Freq: Once | INTRAVENOUS | Status: AC | PRN
  Administered 2023-12-14: 10.4 via INTRAVENOUS

## 2023-12-14 MED ORDER — REGADENOSON 0.4 MG/5ML IV SOLN
0.4000 mg | Freq: Once | INTRAVENOUS | Status: AC
  Administered 2023-12-14: 0.4 mg via INTRAVENOUS

## 2023-12-15 ENCOUNTER — Ambulatory Visit: Payer: Self-pay | Admitting: Cardiology

## 2023-12-16 ENCOUNTER — Ambulatory Visit: Payer: Self-pay | Admitting: Cardiology

## 2024-01-21 NOTE — Progress Notes (Signed)
 Patient ID: John Randolph                 DOB: 02-12-1955                    MRN: [id number]      HPI: John Randolph is a 69 y.o. male patient referred to lipid clinic by Oneil Parchment, MD. PMH is significant for CAD s/p PCI (2022), HLD, OSA, and tobacco use.   Patient previously on rosuvastatin  40 mg for hyperlipidemia but was discontinued due to loss of appetite. He has also trailed atorvastatin  and simvastatin in the past but was discontinued due to muscle aches. He was switched to ezetimibe 10 mg but experienced appetite suppression with daily use, as a result, he has been taking the medication three times per week, which he tolerates better.  Today, the patient expressed willingness to explore alternative LDL-lowering therapies and to consider restarting rosuvastatin  at a lower dose. He reports an unhealthy diet and is not ready to implement dietary changes at this time. He is a current smoker (approximately 2 packs per day). Alternative nicotine  replacement options were discussed; however, the patient is currently attempting to reduce cigarette use on his own. The importance of lifestyle interventions for LDL reduction was reiterated.  Reviewed options for lowering LDL cholesterol, including statins, ezetimibe and PCSK-9 inhibitors. Discussed mechanisms of action, dosing, side effects and potential decreases in LDL cholesterol.  Also reviewed cost information and potential options for patient assistance.   Current Medications: Ezetimibe 10mg  (3 times weekly)  Intolerances: Rosuvastatin  40 mg (appetite suppression), Atorvastatin  (aches, diarrhea), Simvastatin(aches)  Risk Factors: CAD, HTN, history of tobacco use  LDL goal: > 50% reduction and LDL <70   Diet: Patient reports his diet is unhealthy and is not ready to make any dietary changes.   Exercise: Walking 42min/day occasionally, Golf 2-3x a week  Family History:  M: Parkinson's Disease, Osteoporosis  F: Stroke (in 62s)   Brother: Obesity, heart issues  Grandfather: Stroke (age: 27)   Social History:  Smoking: Currently using low tar/nicotine  cigarettes  (2 packs/day) Alcohol: No Recreational Drugs: No   Labs: CHOL: 242 (05/12/23)  TRIG: 126 (05/12/2023)  LDL: 177 (05/12/23)   Lipid Panel     Component Value Date/Time   CHOL 116 03/13/2021 0849   TRIG 79 03/13/2021 0849   HDL 40 03/13/2021 0849   CHOLHDL 2.9 03/13/2021 0849   CHOLHDL 6.2 11/24/2020 2352   VLDL 49 (H) 11/24/2020 2352   LDLCALC 60 03/13/2021 0849   LABVLDL 16 03/13/2021 0849    Past Medical History:  Diagnosis Date   Hyperlipidemia    Hypertension    Hypothyroidism    Sleep apnea    DX years ago but never used CPAP   Thyroid  disease    hypothyroidism    Medications Ordered Prior to Encounter[1]  Allergies[2]  Assessment/Plan:  1. Hyperlipidemia -  Problem  Mixed Hyperlipidemia   Mixed hyperlipidemia Assessment:  LDL goal: <70 mg/dl last LDLc 177mg /dl (Apr 7974) Tolerates Zetia 10mg  at a lower frequency of 3 times a week, rather than daily (due to appetite suppression)  Intolerance to Rosuvastatin  40 mg (appetite suppression), Atorvastatin  (aches, diarrhea), and Simvastatin (aches)  Discussed next potential options (lower dose statin therapy and PCSK-9 inhibitors); cost, dosing efficacy, side effects  Reiterated importance of lifestyle interventions including regular exercise, healthy diet and smoking cessation Patient willing to trial Rosuvastatin  5mg  as well as PCSK9i   Plan: Continue taking Ezetimbe  10mg  three times weekly Initiate Rosuvastatin  5 mg daily; if appetite suppression occurs, reduce dosing to every other day, and if symptoms persist, further reduce to three times weekly Will apply for PA for PCSK9i; will inform patient upon approval  F/u in 3 weeks via phone call in regards to medication tolerance and side effects     Thank you,  Zunaira Afsar, PharmD Candidate   Robbi Blanch,  Pharm.D [facility] [address], [place], [place] [address] Phone: [phone]; Fax: [phone]         [1]  Current Outpatient Medications on File Prior to Visit  Medication Sig Dispense Refill   aspirin  EC (ASPIRIN  LOW DOSE) 81 MG tablet TAKE 1 TABLET (81 MG TOTAL) BY MOUTH DAILY. SWALLOW WHOLE. 30 tablet 0   levothyroxine  (SYNTHROID , LEVOTHROID) 100 MCG tablet Take 100 mcg by mouth daily before breakfast.     LORazepam (ATIVAN) 0.5 MG tablet Take 0.5 mg by mouth at bedtime as needed for sleep.     nicotine  polacrilex (NICORETTE ) 2 MG gum Take 1 each (2 mg total) by mouth as needed for smoking cessation. 100 tablet 0   nitroGLYCERIN  (NITROSTAT ) 0.4 MG SL tablet Place 1 tablet (0.4 mg total) under the tongue every 5 (five) minutes as needed for chest pain. 25 tablet 3   rosuvastatin  (CRESTOR ) 20 MG tablet TAKE 1 TABLET BY MOUTH EVERY DAY 15 tablet 0   tadalafil  (CIALIS ) 5 MG tablet Take 1 tablet (5 mg total) by mouth daily as needed for erectile dysfunction. Do not take in combination with nitro. Nitro can be taken 48 hrs after cialis . 10 tablet 0   No current facility-administered medications on file prior to visit.  [2]  Allergies Allergen Reactions   Crestor  [Rosuvastatin ] Other (See Comments)    Aches    Hydrochlorothiazide Other (See Comments)    dizziness   Lipitor  [Atorvastatin ] Other (See Comments)    Aches and diarrhea    Zocor [Simvastatin] Other (See Comments)    aches

## 2024-01-22 ENCOUNTER — Encounter: Payer: Self-pay | Admitting: Pharmacist

## 2024-01-22 ENCOUNTER — Telehealth: Payer: Self-pay | Admitting: Pharmacy Technician

## 2024-01-22 ENCOUNTER — Telehealth: Payer: Self-pay | Admitting: Pharmacist

## 2024-01-22 ENCOUNTER — Ambulatory Visit: Attending: Cardiology | Admitting: Pharmacist

## 2024-01-22 ENCOUNTER — Other Ambulatory Visit (HOSPITAL_COMMUNITY): Payer: Self-pay

## 2024-01-22 DIAGNOSIS — E782 Mixed hyperlipidemia: Secondary | ICD-10-CM

## 2024-01-22 MED ORDER — ROSUVASTATIN CALCIUM 5 MG PO TABS
5.0000 mg | ORAL_TABLET | Freq: Every day | ORAL | 3 refills | Status: DC
  Filled 2024-01-22: qty 90, 90d supply, fill #0

## 2024-01-22 MED ORDER — REPATHA SURECLICK 140 MG/ML ~~LOC~~ SOAJ: 140.0000 mg | SUBCUTANEOUS | 3 refills | Status: AC

## 2024-01-22 NOTE — Assessment & Plan Note (Signed)
 Assessment:  LDL goal: <70 mg/dl last LDLc 177mg /dl (Apr 7974) Tolerates Zetia 10mg  at a lower frequency of 3 times a week, rather than daily (due to appetite suppression)  Intolerance to Rosuvastatin  40 mg (appetite suppression), Atorvastatin  (aches, diarrhea), and Simvastatin (aches)  Discussed next potential options (lower dose statin therapy and PCSK-9 inhibitors); cost, dosing efficacy, side effects  Reiterated importance of lifestyle interventions including regular exercise, healthy diet and smoking cessation Patient willing to trial Rosuvastatin  5mg  as well as PCSK9i   Plan: Continue taking Ezetimbe 10mg  three times weekly Initiate Rosuvastatin  5 mg daily; if appetite suppression occurs, reduce dosing to every other day, and if symptoms persist, further reduce to three times weekly Will apply for PA for PCSK9i; will inform patient upon approval  F/u in 3 weeks via phone call in regards to medication tolerance and side effects

## 2024-01-22 NOTE — Telephone Encounter (Signed)
" ° °  Pharmacy Patient Advocate Encounter   Received notification from Pt Calls Messages that prior authorization for repatha  is required/requested.   Insurance verification completed.   The patient is insured through [facility]   Per test claim: PA required; PA submitted to above mentioned insurance via Latent Key/confirmation #/EOC [id number] Status is pending  "

## 2024-01-22 NOTE — Telephone Encounter (Signed)
 Pharmacy Patient Advocate Encounter  Received notification from [employer] that Prior Authorization for repatha  has been APPROVED from 01/22/24 to 01/12/25. Ran test claim, Copay is $519.47. This test claim was processed through [facility]- copay amounts may vary at other pharmacies due to pharmacy/plan contracts, or as the patient moves through the different stages of their insurance plan.   PA #/Case ID/Reference #: [id number]    Next fil is 19.47 he has a 500.00 deductible

## 2024-01-22 NOTE — Addendum Note (Signed)
 Addended by: Merced Brougham K on: 01/22/2024 03:52 PM   Modules accepted: Orders

## 2024-01-22 NOTE — Addendum Note (Signed)
 Addended by: Malikye Reppond K on: 01/22/2024 03:49 PM   Modules accepted: Orders

## 2024-01-25 ENCOUNTER — Telehealth: Payer: Self-pay | Admitting: Pharmacy Technician

## 2024-01-25 NOTE — Telephone Encounter (Signed)
" ° °  Patient Advocate Encounter   The patient was approved for a [employer] grant that will help cover the cost of REPATHA  Total amount awarded, 2500.  Effective: 12/26/23 - 12/24/24   APW:[id number] ERW:[id number] Hmnle:[id number] PI:[id number] [employer] ID: [id number]   Pharmacy provided with approval and processing information. Patient informed via [employer] given info "

## 2024-01-25 NOTE — Telephone Encounter (Signed)
 The patient was approved for a [employer] grant that will help cover the cost of REPATHA  Total amount awarded, 2500.  Effective: 12/26/23 - 12/24/24   APW:[id number] ERW:[id number] Hmnle:[id number] [id number] [employer] ID: [id number]   Pharmacy provided with approval and processing information. Patient informed via mychart   [facility] given info

## 2024-01-28 NOTE — Telephone Encounter (Signed)
 PA for Repatha  approved see other encounter for more info

## 2024-01-29 ENCOUNTER — Other Ambulatory Visit (HOSPITAL_COMMUNITY): Payer: Self-pay

## 2024-01-29 MED ORDER — ROSUVASTATIN CALCIUM 5 MG PO TABS: 5.0000 mg | ORAL_TABLET | Freq: Every day | ORAL | 3 refills | Status: AC

## 2024-01-29 NOTE — Telephone Encounter (Signed)
 Patient informed via phone about grant approval. Will be starting Repatha  when come back from vacation end of Jan follow up lab due end of April

## 2024-01-29 NOTE — Addendum Note (Signed)
 Addended by: Jaymari Cromie K on: 01/29/2024 09:36 AM   Modules accepted: Orders
